# Patient Record
Sex: Male | Born: 1965 | Race: White | Hispanic: No | Marital: Single | Smoking: Current every day smoker
Health system: Southern US, Community
[De-identification: ages and names within clinical notes are randomized; demographics above are authoritative.]

## PROBLEM LIST (undated history)

## (undated) DIAGNOSIS — I10 Essential (primary) hypertension: Secondary | ICD-10-CM

## (undated) DIAGNOSIS — E669 Obesity, unspecified: Secondary | ICD-10-CM

## (undated) DIAGNOSIS — M654 Radial styloid tenosynovitis [de Quervain]: Secondary | ICD-10-CM

## (undated) DIAGNOSIS — F1190 Opioid use, unspecified, uncomplicated: Secondary | ICD-10-CM

## (undated) DIAGNOSIS — K4090 Unilateral inguinal hernia, without obstruction or gangrene, not specified as recurrent: Secondary | ICD-10-CM

## (undated) DIAGNOSIS — M1990 Unspecified osteoarthritis, unspecified site: Secondary | ICD-10-CM

## (undated) DIAGNOSIS — M545 Low back pain, unspecified: Secondary | ICD-10-CM

## (undated) DIAGNOSIS — F1090 Alcohol use, unspecified, uncomplicated: Secondary | ICD-10-CM

## (undated) DIAGNOSIS — J432 Centrilobular emphysema: Secondary | ICD-10-CM

## (undated) DIAGNOSIS — A6000 Herpesviral infection of urogenital system, unspecified: Secondary | ICD-10-CM

## (undated) DIAGNOSIS — I4510 Unspecified right bundle-branch block: Secondary | ICD-10-CM

## (undated) DIAGNOSIS — E785 Hyperlipidemia, unspecified: Secondary | ICD-10-CM

## (undated) DIAGNOSIS — I209 Angina pectoris, unspecified: Secondary | ICD-10-CM

## (undated) DIAGNOSIS — I2510 Atherosclerotic heart disease of native coronary artery without angina pectoris: Principal | ICD-10-CM

## (undated) DIAGNOSIS — M5030 Other cervical disc degeneration, unspecified cervical region: Secondary | ICD-10-CM

## (undated) DIAGNOSIS — M5135 Other intervertebral disc degeneration, thoracolumbar region: Secondary | ICD-10-CM

## (undated) DIAGNOSIS — Z7982 Long term (current) use of aspirin: Secondary | ICD-10-CM

## (undated) DIAGNOSIS — I5189 Other ill-defined heart diseases: Secondary | ICD-10-CM

## (undated) DIAGNOSIS — Z720 Tobacco use: Secondary | ICD-10-CM

## (undated) DIAGNOSIS — N529 Male erectile dysfunction, unspecified: Secondary | ICD-10-CM

## (undated) HISTORY — PX: APPENDECTOMY: SHX54

## (undated) HISTORY — DX: Atherosclerotic heart disease of native coronary artery without angina pectoris: I25.10

## (undated) HISTORY — DX: Hyperlipidemia, unspecified: E78.5

---

## 1995-12-23 ENCOUNTER — Encounter: Payer: Self-pay | Admitting: Family Medicine

## 1995-12-23 LAB — CONVERTED CEMR LAB
RBC count: 5.24 10*6/uL
WBC, blood: 8.5 10*3/uL

## 1996-01-27 ENCOUNTER — Encounter: Payer: Self-pay | Admitting: Family Medicine

## 1996-01-27 LAB — CONVERTED CEMR LAB: PSA: 0.6 ng/mL

## 2006-05-16 ENCOUNTER — Ambulatory Visit: Payer: Self-pay | Admitting: Family Medicine

## 2006-05-16 ENCOUNTER — Inpatient Hospital Stay (HOSPITAL_COMMUNITY): Admission: EM | Admit: 2006-05-16 | Discharge: 2006-05-17 | Payer: Self-pay | Admitting: Emergency Medicine

## 2006-05-16 ENCOUNTER — Encounter (INDEPENDENT_AMBULATORY_CARE_PROVIDER_SITE_OTHER): Payer: Self-pay | Admitting: *Deleted

## 2007-03-03 ENCOUNTER — Telehealth (INDEPENDENT_AMBULATORY_CARE_PROVIDER_SITE_OTHER): Payer: Self-pay | Admitting: *Deleted

## 2008-09-11 ENCOUNTER — Ambulatory Visit: Payer: Self-pay | Admitting: Family Medicine

## 2008-09-16 ENCOUNTER — Encounter: Payer: Self-pay | Admitting: Family Medicine

## 2008-09-16 DIAGNOSIS — Z87898 Personal history of other specified conditions: Secondary | ICD-10-CM

## 2008-11-01 ENCOUNTER — Ambulatory Visit: Payer: Self-pay | Admitting: Family Medicine

## 2008-11-01 DIAGNOSIS — J069 Acute upper respiratory infection, unspecified: Secondary | ICD-10-CM | POA: Insufficient documentation

## 2009-05-29 ENCOUNTER — Telehealth: Payer: Self-pay | Admitting: Family Medicine

## 2009-05-29 DIAGNOSIS — A6000 Herpesviral infection of urogenital system, unspecified: Secondary | ICD-10-CM | POA: Insufficient documentation

## 2009-10-22 ENCOUNTER — Ambulatory Visit: Payer: Self-pay | Admitting: Family Medicine

## 2009-10-22 DIAGNOSIS — F17200 Nicotine dependence, unspecified, uncomplicated: Secondary | ICD-10-CM

## 2010-06-02 ENCOUNTER — Encounter (INDEPENDENT_AMBULATORY_CARE_PROVIDER_SITE_OTHER): Payer: Self-pay | Admitting: *Deleted

## 2010-11-26 NOTE — Letter (Signed)
Summary: Nadara Eaton letter  [facility]  [address] [place], [place] [address]   Phone: [phone]  Fax: [phone]       06/02/2010 MRN: [id number]  GYAN CAMBRE [address] [place], [place]  [address]  Dear Mr. [facility], and  announce the retirement of Arta Silence, M.D., from full-time practice at the [facility] office effective April 23, 2010 and his plans of returning part-time.  It is important to Dr. Hetty Ely and to our practice that you understand that [facility] has seven physicians in our office for your health care needs.  We will continue to offer the same exceptional care that you have today.    Dr. Hetty Ely has spoken to many of you about his plans for retirement and returning part-time in the fall.   We will continue to work with you through the transition to schedule appointments for you in the office and meet the high standards that [facility] is committed to.   Again, it is with great pleasure that we share the news that Dr. Hetty Ely will return to [facility] at [facility] in October of 2011 with a reduced schedule.    If you have any questions, or would like to request an appointment with one of our physicians, please call us at [phone] and press the option for Scheduling an appointment.  We take pleasure in providing you with excellent patient care and look forward to seeing you at your next office visit.  Our [facility] Physicians are:  Tillman Abide, M.D. Laurita Quint, M.D. Roxy Manns, M.D. Kerby Nora, M.D. Hannah Beat, M.D. Ruthe Mannan, M.D. We proudly welcomed Raechel Ache, M.D. and Eustaquio Boyden, M.D. to the practice in July/August 2011.  Sincerely,  [facility]

## 2011-03-12 NOTE — Op Note (Signed)
NAME:  [name], [name]                  ACCOUNT NO.:  [id number]   MEDICAL RECORD NO.:  [id number]          PATIENT TYPE:  INP   LOCATION:  5729                         FACILITY:  [facility]   PHYSICIAN:  Sandria Bales. Ezzard Standing, M.D.  DATE OF BIRTH:  December 21, 1965   DATE OF PROCEDURE:  05/16/2006  DATE OF DISCHARGE:                                 OPERATIVE REPORT   PREOPERATIVE DIAGNOSIS:  Appendicitis.   POSTOPERATIVE DIAGNOSIS:  Ruptured appendicitis with a rupture at the base.   PROCEDURE:  Laparoscopic appendectomy.   SURGEON:  Ovidio Kin, MD   ANESTHESIA:  General endotracheal.   ESTIMATED BLOOD LOSS:  Minimal.   REASON FOR PROCEDURE:  [name] is a 45 year old white male who has  presented with a 24-hour history of increasing abdominal pain, which  localized to his right lower quadrant.  He has a physical exam consistent  with appendicitis and elevated white blood count to about 23,000.  He now  comes for attempted laparoscopic appendectomy.  Other potential diagnoses  were discussed with the patient, including but not limited to  diverticulitis, Crohn disease, and other inflammatory condition.   The indications for the procedure and complications of the procedure were  explained to the patient, potential complications including but not limited  to bleeding, infection, bowel injury, and open surgery.   OPERATIVE NOTE:  The patient was placed in a supine position with his left  arm prepped, right arm out to the side.  Foley catheter in place.  He was  given 2 g of cefoxitin prior to the procedure.   His abdomen was shaved.  An infraumbilical incision was made with sharp  dissection carried down into the abdominal cavity.  A 0-degree, 10-mm  laparoscope was inserted through a 12-mm Hasson trocar and the Hasson trocar  secured with a 0 Vicryl suture.   The abdomen was explored.  Right and left lobes of the liver unremarkable.  There was a fair amount of purulence along his right  colonic gutter.  I did  place 2 additional trocars, a 5-mm trocar in the right subcostal location  and a 10-mm trocar in the left lower quadrant.   The appendix was identified.  It was acutely inflamed and necrotic.  The  Harmonic scalpel was used to dissect it up, take the mesentery down.  At the  base of the appendix, only about maybe 1.5 cm from the verge where it comes  off the cecum, the appendix had ruptured, and I was able to catch this  mesentery and pull the appendix up and get a blue load Endo-GIA 45-mm  stapler across the appendiceal base.   I then, after dividing the appendix, placed the appendix in an EndoCatch bag  and delivered it to the umbilicus.  I then reinspected the abdomen and  irrigated it with about 1800 mL of saline, looking at the staple line, which  looked good, looking at the mesentery, which there was no evidence of any  bleeding, and irrigating any kind of purulent material I could.   The patient tolerated the procedure well.  The trocars were then removed in  turn.  Umbilical port was closed with a 0-Vicryl suture.  The skin edges  were approximated with a 5-0 Vicryl suture, followed by a tincture of  benzoin and Steri-Stripped.   The patient tolerated the procedure well.  Sponge and needle count were  correct.      Sandria Bales. Ezzard Standing, M.D.  Electronically Signed     DHN/MEDQ  D:  05/16/2006  T:  05/17/2006  Job:  [id number]

## 2011-03-12 NOTE — Discharge Summary (Signed)
NAME:  [name], [name]                  ACCOUNT NO.:  [id number]   MEDICAL RECORD NO.:  [id number]          PATIENT TYPE:  INP   LOCATION:  5729                         FACILITY:  [facility]   PHYSICIAN:  Sandria Bales. Ezzard Standing, M.D.  DATE OF BIRTH:  08-May-1966   DATE OF ADMISSION:  05/16/2006  DATE OF DISCHARGE:  05/17/2006                                 DISCHARGE SUMMARY   CHIEF COMPLAINT/REASON FOR ADMISSION:  [name] is a 45 year old white  male normally healthy who developed abdominal pain less than 24 hours  duration.  He had significant nausea, attempted milk of magnesia and Tums  without any improvements in his symptoms.  On exam, he was afebrile, vital  signs were stable.  His abdomen was exquisitely tender in the right lower  quadrant with guarding and rebounding.  His white count ws 22,000.  No other  labs were available at the time of admission.  Based on these finding, Dr.  Ezzard Standing felt that the patient had a diagnosis of acute appendicitis.   ADMISSION DIAGNOSIS:  Probable acute appendicitis.   HOSPITAL COURSE:  The patient was taken directly from the emergency  department to the operating room by Dr. Ezzard Standing where he under went a  laparoscopic appendectomy.  Postoperative diagnosis was ruptured  appendicitis.  The appendix was ruptured at the base.  The patient tolerated  the procedure well and went to the post anesthesia care unit and then to the  floor to recover.   On postoperative day #1, patient was afebrile, vital signs were stable, was  tolerating a regular diet with flatus and requesting to go home.  He was  having incisional pain, but otherwise abdomen was benign, soft, with bowel  sounds present.  Because the patient had significant leukocytosis, white  count 24,400, on May 16, 2006 a repeat white cell count was obtained before  patient was discharged home.  This was down to 17,000.  The patient, later  in the day, was tolerating a diet advance and oral pain medication  with no  increasing abdominal pain and remained afebrile, and was deemed appropriate  for discharge home on oral antibiotics and pain medication.   FINAL DISCHARGE DIAGNOSES:  1. Acute appendicitis, perforated at the base.  2. Status post laparoscopic appendectomy.   DISCHARGE MEDICATIONS:  1. Vicodin 5/500 1 to 2 every 6 hours as needed for pain.  2. Augmentin 875 mg 1 b.i.d. for 10 days.   DIET:  No restrictions.   Return to work after seeing Dr. Ezzard Standing.   ACTIVITY:  No driving for 2 weeks.  Increase activity slowly.  May shower.  No lifting for 3 weeks.   WOUND CARE:  Allow Steri-Strips to fall off.  Remove abdominal dressings in  the morning on May 18, 2006.   ADDITIONAL INSTRUCTIONS:  Call MD if:  A.  Oral temperature more than 100.5 degrees Fahrenheit.  B.  Increase in belly pain.  C.  Cannot eat, nausea or vomiting.  D.  Redness or drainage from surgery wounds.   FOLLOWUP:  Dr. Ezzard Standing [phone].  Have an  appointment schedule for May 25, 2006 at 9:00 a.m.      Allison L. Rennis Harding, N.P.      Sandria Bales. Ezzard Standing, M.D.  Electronically Signed    ALE/MEDQ  D:  06/10/2006  T:  06/11/2006  Job:  [id number]   cc:   Lance A. Milinda Antis, MD

## 2011-03-12 NOTE — H&P (Signed)
NAME:  [name]                  ACCOUNT NO.:  [id number]   MEDICAL RECORD NO.:  [id number]          PATIENT TYPE:  INP   LOCATION:  1829                         FACILITY:  [facility]   PHYSICIAN:  Sandria Bales. Ezzard Standing, M.D.  DATE OF BIRTH:  1966-04-28   DATE OF PROCEDURE:  DATE OF DISCHARGE:                      STAT - MUST CHANGE TO CORRECT WORK TYPE   HISTORY OF PRESENT ILLNESS:  This is a 45 year old white male in otherwise  good health.  He denies any history of peptic ulcer disease, liver disease,  colon disease.  He has had no prior abdominal complaints or surgery.  He has  had increasing abdominal pain since yesterday.  His pain began while he was  in [place], [place]. 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  He had increased nausea last night, and he tried milk of magnesia,  he tried Tums, all of that with no improvement in symptoms.   He went to see Dr. Roxy Manns at [facility] this  morning, and she sent him by ambulance to the emergency room.   He had a lot of pain when his abdomen hit bumps, with tenderness and  guarding of his lower abdomen.   PAST MEDICAL HISTORY:   ALLERGIES:  AMOXICILLIN which made him itch.  This was when he was a  teenager.   MEDICATIONS:  He is on no regular medications.   REVIEW OF SYSTEMS:  NEUROLOGIC: No seizures or loss of consciousness.  PULMONARY:  Does not smoke. no history of pnuemonia  CARDIAC:  He had no chest pain or angina.  GASTROINTESTINAL:  See history of present illness.  UROLOGIC:  No history of stones or kidney infections.   SOCIAL HISTORY:  He is separated from his wife.  They have no children.  He  works Psychologist, clinical.  I spoke to his mother preop by  cell phone.   PHYSICAL EXAMINATION:  VITAL SIGNS:  Temperature is 97.1, pulse 76,  respirations 24, blood pressure 123/66.  GENERAL:  He is somewhat flushed in the face, like he has an inflammatory  response  going on.  NECK:  Supple.  No mass or thyromegaly.  LUNGS:  Clear to auscultation.  HEART:  Regular rate and rhythm without murmur or rub.  ABDOMEN:  He has exquisite tenderness in his right lower quadrant with  guarding and rebound.  No hernia, no mass.  GENITALIA:  Both his testicles are descended.  EXTREMITIES:  He has good strength in all 4 extremities.  NEUROLOGIC:  Grossly intact.   LABORATORY DATA:  His white blood count is approximately 22,000.  I do not  have his other labs back at this time.   IMPRESSION:  He has probable appendicitis.  I think he should go to the  operating room.  I have explained to the patient about appendectomy, that I  can do this laparoscopically, and the potential of other diagnoses,  including Crohn's disease, diverticular disease or some other unknown  developed process.   He would be best served by having  laparoscopic evaluation with an  appendectomy.  I discussed potential complications which include infection,  which I think he has already got going on, bleeding, need for open surgery.      Sandria Bales. Ezzard Standing, M.D.  Electronically Signed     DHN/MEDQ  D:  05/16/2006  T:  05/16/2006  Job:  [id number]   cc:   Marne A. Tower, M.D. [facility]  [address], [place]  [place] [address]

## 2012-02-23 DIAGNOSIS — I214 Non-ST elevation (NSTEMI) myocardial infarction: Secondary | ICD-10-CM

## 2012-02-23 HISTORY — DX: Non-ST elevation (NSTEMI) myocardial infarction: I21.4

## 2012-02-23 HISTORY — PX: CORONARY STENT PLACEMENT: SHX1402

## 2012-03-17 ENCOUNTER — Encounter (HOSPITAL_COMMUNITY): Payer: Self-pay | Admitting: *Deleted

## 2012-03-17 ENCOUNTER — Inpatient Hospital Stay (HOSPITAL_COMMUNITY)
Admission: EM | Admit: 2012-03-17 | Discharge: 2012-03-19 | DRG: 247 | Disposition: A | Discharge status: Home / Self Care | Payer: 59 | Attending: Cardiovascular Disease | Admitting: Cardiovascular Disease

## 2012-03-17 ENCOUNTER — Emergency Department (HOSPITAL_COMMUNITY): Payer: Self-pay

## 2012-03-17 ENCOUNTER — Encounter (HOSPITAL_COMMUNITY)
Admission: EM | Disposition: A | Discharge status: Home / Self Care | Payer: Self-pay | Source: Home / Self Care | Attending: Cardiovascular Disease

## 2012-03-17 DIAGNOSIS — Z955 Presence of coronary angioplasty implant and graft: Secondary | ICD-10-CM

## 2012-03-17 DIAGNOSIS — I2510 Atherosclerotic heart disease of native coronary artery without angina pectoris: Secondary | ICD-10-CM

## 2012-03-17 DIAGNOSIS — E669 Obesity, unspecified: Secondary | ICD-10-CM | POA: Diagnosis present

## 2012-03-17 DIAGNOSIS — I214 Non-ST elevation (NSTEMI) myocardial infarction: Principal | ICD-10-CM

## 2012-03-17 DIAGNOSIS — F17200 Nicotine dependence, unspecified, uncomplicated: Secondary | ICD-10-CM | POA: Diagnosis present

## 2012-03-17 DIAGNOSIS — M545 Low back pain, unspecified: Secondary | ICD-10-CM | POA: Diagnosis present

## 2012-03-17 DIAGNOSIS — Z6833 Body mass index (BMI) 33.0-33.9, adult: Secondary | ICD-10-CM

## 2012-03-17 HISTORY — DX: Essential (primary) hypertension: I10

## 2012-03-17 HISTORY — DX: Atherosclerotic heart disease of native coronary artery without angina pectoris: I25.10

## 2012-03-17 HISTORY — DX: Low back pain: M54.5

## 2012-03-17 HISTORY — DX: Obesity, unspecified: E66.9

## 2012-03-17 HISTORY — PX: INTRAVASCULAR ULTRASOUND: SHX5452

## 2012-03-17 HISTORY — DX: Tobacco use: Z72.0

## 2012-03-17 HISTORY — PX: LEFT HEART CATHETERIZATION WITH CORONARY ANGIOGRAM: SHX5451

## 2012-03-17 HISTORY — DX: Low back pain, unspecified: M54.50

## 2012-03-17 LAB — TSH: TSH: 0.673 u[IU]/mL (ref 0.350–4.500)

## 2012-03-17 LAB — BASIC METABOLIC PANEL
BUN: 18 mg/dL (ref 6–23)
Chloride: 100 mEq/L (ref 96–112)
Glucose, Bld: 114 mg/dL — ABNORMAL HIGH (ref 70–99)
Sodium: 136 mEq/L (ref 135–145)

## 2012-03-17 LAB — CBC
MCHC: 34.9 g/dL (ref 30.0–36.0)
MCV: 86.4 fL (ref 78.0–100.0)
Platelets: 249 10*3/uL (ref 150–400)
RDW: 12.7 % (ref 11.5–15.5)

## 2012-03-17 LAB — POCT I-STAT TROPONIN I: Troponin i, poc: 0.11 ng/mL (ref 0.00–0.08)

## 2012-03-17 LAB — CARDIAC PANEL(CRET KIN+CKTOT+MB+TROPI)
CK, MB: 105.3 ng/mL (ref 0.3–4.0)
Relative Index: 8.2 — ABNORMAL HIGH (ref 0.0–2.5)
Relative Index: 7.8 — ABNORMAL HIGH (ref 0.0–2.5)
Total CK: 1352 U/L — ABNORMAL HIGH (ref 7–232)

## 2012-03-17 LAB — APTT: aPTT: 30 seconds (ref 24–37)

## 2012-03-17 LAB — PROTIME-INR: Prothrombin Time: 12.6 seconds (ref 11.6–15.2)

## 2012-03-17 SURGERY — LEFT HEART CATHETERIZATION WITH CORONARY ANGIOGRAM
Anesthesia: LOCAL

## 2012-03-17 MED ORDER — MORPHINE SULFATE 4 MG/ML IJ SOLN
INTRAMUSCULAR | Status: AC
  Administered 2012-03-17: 4 mg
  Filled 2012-03-17: qty 1

## 2012-03-17 MED ORDER — FENTANYL CITRATE 0.05 MG/ML IJ SOLN
INTRAMUSCULAR | Status: AC
  Filled 2012-03-17: qty 2

## 2012-03-17 MED ORDER — NITROGLYCERIN 0.4 MG SL SUBL: 0.4000 mg | SUBLINGUAL_TABLET | SUBLINGUAL | Status: DC | PRN

## 2012-03-17 MED ORDER — LIDOCAINE HCL (PF) 1 % IJ SOLN
INTRAMUSCULAR | Status: AC
  Filled 2012-03-17: qty 30

## 2012-03-17 MED ORDER — NITROGLYCERIN IN D5W 200-5 MCG/ML-% IV SOLN
2.0000 ug/min | Freq: Once | INTRAVENOUS | Status: AC
  Administered 2012-03-17: 5 ug/min via INTRAVENOUS
  Filled 2012-03-17: qty 250

## 2012-03-17 MED ORDER — SODIUM CHLORIDE 0.9 % IJ SOLN: 3.0000 mL | INTRAMUSCULAR | Status: DC | PRN

## 2012-03-17 MED ORDER — OXYCODONE-ACETAMINOPHEN 5-325 MG PO TABS: 1.0000 | ORAL_TABLET | ORAL | Status: DC | PRN

## 2012-03-17 MED ORDER — ONDANSETRON HCL 4 MG/2ML IJ SOLN: 4.0000 mg | Freq: Four times a day (QID) | INTRAMUSCULAR | Status: DC | PRN

## 2012-03-17 MED ORDER — MORPHINE SULFATE 2 MG/ML IJ SOLN
2.0000 mg | Freq: Once | INTRAMUSCULAR | Status: AC
  Administered 2012-03-17: 2 mg via INTRAVENOUS

## 2012-03-17 MED ORDER — MIDAZOLAM HCL 2 MG/2ML IJ SOLN
INTRAMUSCULAR | Status: AC
  Filled 2012-03-17: qty 2

## 2012-03-17 MED ORDER — ASPIRIN 81 MG PO CHEW
324.0000 mg | CHEWABLE_TABLET | Freq: Once | ORAL | Status: AC
  Administered 2012-03-17: 324 mg via ORAL
  Filled 2012-03-17: qty 4

## 2012-03-17 MED ORDER — ACETAMINOPHEN 325 MG PO TABS: 650.0000 mg | ORAL_TABLET | ORAL | Status: DC | PRN

## 2012-03-17 MED ORDER — SODIUM CHLORIDE 0.9 % IJ SOLN
3.0000 mL | Freq: Two times a day (BID) | INTRAMUSCULAR | Status: DC
  Administered 2012-03-17 – 2012-03-19 (×4): 3 mL via INTRAVENOUS

## 2012-03-17 MED ORDER — ASPIRIN EC 81 MG PO TBEC
81.0000 mg | DELAYED_RELEASE_TABLET | Freq: Every day | ORAL | Status: DC
  Administered 2012-03-18 – 2012-03-19 (×2): 81 mg via ORAL
  Filled 2012-03-17 (×2): qty 1

## 2012-03-17 MED ORDER — DIAZEPAM 5 MG PO TABS
5.0000 mg | ORAL_TABLET | ORAL | Status: DC | PRN
  Administered 2012-03-17 – 2012-03-18 (×2): 5 mg via ORAL
  Filled 2012-03-17 (×2): qty 1

## 2012-03-17 MED ORDER — HEPARIN BOLUS VIA INFUSION
4000.0000 [IU] | Freq: Once | INTRAVENOUS | Status: AC
  Administered 2012-03-17: 4000 [IU] via INTRAVENOUS

## 2012-03-17 MED ORDER — SODIUM CHLORIDE 0.9 % IV SOLN
0.2500 mg/kg/h | INTRAVENOUS | Status: AC
  Administered 2012-03-17: 0.25 mg/kg/h via INTRAVENOUS
  Filled 2012-03-17: qty 250

## 2012-03-17 MED ORDER — SODIUM CHLORIDE 0.9 % IV SOLN: 250.0000 mL | INTRAVENOUS | Status: DC | PRN

## 2012-03-17 MED ORDER — ATORVASTATIN CALCIUM 80 MG PO TABS
80.0000 mg | ORAL_TABLET | Freq: Every day | ORAL | Status: DC
  Administered 2012-03-17 – 2012-03-18 (×2): 80 mg via ORAL
  Filled 2012-03-17 (×3): qty 1

## 2012-03-17 MED ORDER — HEPARIN SODIUM (PORCINE) 5000 UNIT/ML IJ SOLN
INTRAMUSCULAR | Status: AC
  Filled 2012-03-17: qty 1

## 2012-03-17 MED ORDER — METOPROLOL TARTRATE 12.5 MG HALF TABLET
12.5000 mg | ORAL_TABLET | Freq: Two times a day (BID) | ORAL | Status: DC
  Administered 2012-03-17 – 2012-03-19 (×4): 12.5 mg via ORAL
  Filled 2012-03-17 (×5): qty 1

## 2012-03-17 MED ORDER — NITROGLYCERIN 0.2 MG/ML ON CALL CATH LAB
INTRAVENOUS | Status: AC
  Filled 2012-03-17: qty 1

## 2012-03-17 MED ORDER — HYDROCODONE-ACETAMINOPHEN 5-325 MG PO TABS: 1.0000 | ORAL_TABLET | Freq: Four times a day (QID) | ORAL | Status: DC | PRN

## 2012-03-17 MED ORDER — BIVALIRUDIN 250 MG IV SOLR
INTRAVENOUS | Status: AC
  Filled 2012-03-17: qty 250

## 2012-03-17 MED ORDER — HEPARIN (PORCINE) IN NACL 100-0.45 UNIT/ML-% IJ SOLN
1000.0000 [IU]/h | INTRAMUSCULAR | Status: DC
  Administered 2012-03-17: 1000 [IU]/h via INTRAVENOUS
  Filled 2012-03-17: qty 250

## 2012-03-17 MED ORDER — MORPHINE SULFATE 4 MG/ML IJ SOLN
4.0000 mg | Freq: Once | INTRAMUSCULAR | Status: AC
  Administered 2012-03-17: 4 mg via INTRAVENOUS
  Filled 2012-03-17: qty 1

## 2012-03-17 MED ORDER — MORPHINE SULFATE 2 MG/ML IJ SOLN
INTRAMUSCULAR | Status: AC
  Administered 2012-03-17: 2 mg via INTRAVENOUS
  Filled 2012-03-17: qty 1

## 2012-03-17 MED ORDER — SODIUM CHLORIDE 0.9 % IV SOLN: INTRAVENOUS | Status: AC

## 2012-03-17 MED ORDER — ONDANSETRON HCL 4 MG/2ML IJ SOLN
INTRAMUSCULAR | Status: AC
  Administered 2012-03-17: 4 mg
  Filled 2012-03-17: qty 2

## 2012-03-17 MED ORDER — PRASUGREL HCL 10 MG PO TABS
ORAL_TABLET | ORAL | Status: AC
  Filled 2012-03-17: qty 6

## 2012-03-17 MED ORDER — PRASUGREL HCL 10 MG PO TABS
10.0000 mg | ORAL_TABLET | Freq: Every day | ORAL | Status: DC
  Administered 2012-03-18 – 2012-03-19 (×2): 10 mg via ORAL
  Filled 2012-03-17 (×2): qty 1

## 2012-03-17 MED ORDER — HEPARIN (PORCINE) IN NACL 2-0.9 UNIT/ML-% IJ SOLN
INTRAMUSCULAR | Status: AC
  Filled 2012-03-17: qty 2000

## 2012-03-17 MED ORDER — MORPHINE SULFATE 2 MG/ML IJ SOLN: 2.0000 mg | INTRAMUSCULAR | Status: DC | PRN

## 2012-03-17 NOTE — Progress Notes (Signed)
CRITICAL VALUE ALERT  Critical value received:  TROPO>25 Date of notification:  03/17/12  Time of notification:  1808  Critical value read back:yes  Nurse who received alert: Arvella Merles I   MD notified  expected result    Time of first page:    MD notified (2nd page):  Time of second page:  Responding MD:    Time MD responded:

## 2012-03-17 NOTE — Progress Notes (Signed)
ADMITTED FROM CATH LAB. BY BED AWAKE AND ALERT. TR BAND TO LEFT WRIST INTACT DENIED ANY DISCOMFORT.

## 2012-03-17 NOTE — ED Notes (Signed)
MD at bedside. 

## 2012-03-17 NOTE — CV Procedure (Signed)
Cardiac Catheterization Operative Report  [name] [id number] 5/24/201312:29 PM Crawford Givens, MD, MD  Procedure Performed:  1. Left Heart Catheterization 2. Selective Coronary Angiography 3. Left ventricular angiogram 4. PTCA/balloon angioplasty Diagonal branch 5. PTCA/DES x 1 mid LAD 6. IVUS mid LAD  Operator: Verne Carrow, MD  Arterial access site:  Right radial artery.   Indication:  NSTEMI, ongoing chest pain.                                    Procedure Details: The risks, benefits, complications, treatment options, and expected outcomes were discussed with the patient. The patient and/or family concurred with the proposed plan, giving informed consent. The patient was brought to the cath lab after IV hydration was begun and oral premedication was given. The patient was further sedated with Versed and Fentanyl. The leftt wrist was assessed with an Allens test which was positive. The left wrist was prepped and draped in a sterile fashion. 1% lidocaine was used for local anesthesia. Using the modified Seldinger access technique, a 6 French sheath was placed in the left radial artery. 1.25 mg Nicardipine was given through the sheath. 3500 units IV heparin was given. Standard diagnostic catheters were used to perform selective coronary angiography. A pigtail catheter was used to perform a left ventricular angiogram. The patient was found to have a totally occluded small diagonal branch. This vessel was 1.5 mm in diameter and felt to be too small for PCI. He also had a hazy mid LAD lesion involving a moderate sized diagonal branch. He was given a bolus of Angiomax and a drip was started. When the ACT was greater than 200, I engaged the left main with a XB LAD 3.5 guiding catheter. A Cougar IC wire was advanced down the LAD.  I then advanced an IVUS catheter down into the mid LAD. IVUS images were obtained from the mid LAD back into the proximal vessel via mechical  pullback. The lesion in the mid LAD was severe and involved the diagonal. The IVUS catheter was removed. I then passed a second Cougar IC wire down the Diagonal branch. A 2.0 x 8 mm balloon was used to dilate the proximal segment of the diagonal branch. This balloon was inflated 3 times in this area. I then advanced a 2.5 x 15 mm balloon into the mid LAD and inflated x 2. A 3.0 x 28 mm Promus Element DES was deployed in the mid LAD. A 3.25 x 20 mm St. Donatus balloon was used to post-dilate the stent. I then used a 3.5 x 8 mm [place] balloon to post-dilate the proximal segment of the stent x 1. There was an excellent result with good flow down the diagonal branch and the LAD. I elected not to intervene on the very small early Diagonal branch which is totally occluded. All wires and the guide were removed.   The sheath was removed from the right radial artery and a Terumo hemostasis band was applied at the arteriotomy site on the right wrist.  There were no immediate complications. The patient was taken to the recovery area in stable condition.    Hemodynamic Findings: Central aortic pressure: 113/73 Left ventricular pressure: 106/16/29  Angiographic Findings:  Left main: No obstructive disease noted.   Left Anterior Descending Artery: Large caliber vessel that courses to the apex. 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  The mid vessel has hazy 70-80% stenosis. The distal vessel has no disease. The first early diagonal branch is very small (1.5 mm) and fills slowly, sub-total occlusion. The second diagonal branch is moderate sized, bifurcating vessel with proximal 80% stenosis.   Circumflex Artery: Large caliber vessel with no obstructive disease. The obtuse marginal branch is large and has no disease.   Right Coronary Artery: Large, dominant vessel with diffuse 30% plaque throughout the mid vessel.   Left Ventricular Angiogram: LVEF 55%.   Impression: 1.  NSTEMI 2.   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  3.  Subtotally occluded very small early diagonal branch which may be culprit for his ongoing pain. Too small for PCI 4.  Preserved LV systolic function  Recommendations: He will be continued on ASA and Effient for one year. Will start beta blocker and statin as pt tolerates. To TCU. Out to floor in am or d/c home in am if doing well.        Complications:  None. The patient tolerated the procedure well.

## 2012-03-17 NOTE — ED Notes (Signed)
MD made aware of patients increase in CP.  Orders received.

## 2012-03-17 NOTE — ED Notes (Signed)
Patient states he had onset of chest pain last night that he thought was indigestion.  Pt was restless over night. Pt was getting ready for work today and had sudden onset of severe chest pain.  Pt had diaphoresis with same. Pt denies any SOB but states that he feels like he is holding his breat because of pain.  Pt states that he has nausea with same, no vomiting. Pt is a smoker and has family hx of cardiac disease.  Patient has no noted edema.  Pt is over weight.

## 2012-03-17 NOTE — ED Notes (Signed)
LeBaurer NP at bedside.

## 2012-03-17 NOTE — ED Provider Notes (Signed)
Medical screening examination/treatment/procedure(s) were conducted as a shared visit with non-physician practitioner(s) and myself.  I personally evaluated the patient during the encounter 46 yo man had mild chest pain last night, much worse chest pain this morning. He took no medicine for this symptom. His father had a heart attack in his mid forties. Exam shows him to be in moderate distress with chest pain. Lungs clear, heart sounds normal, abdomen non-tender. EKG shows no acute change but TNI is elevated at 0.11. Dx NSTEMI. Rx with aspirin, SL NTG --> NTG infusion, IV heparin. Call to [facility], 9:25 AM  who will see pt.        Carleene Cooper III, MD 03/17/12 2043

## 2012-03-17 NOTE — Interval H&P Note (Signed)
History and Physical Interval Note:  03/17/2012 10:48 AM  [name]  has presented today for surgery, with the diagnosis of chest pain  The various methods of treatment have been discussed with the patient and family. After consideration of risks, benefits and other options for treatment, the patient has consented to  Procedure(s) (LRB): LEFT HEART CATHETERIZATION WITH CORONARY ANGIOGRAM (N/A) as a surgical intervention .  The patients' history has been reviewed, patient examined, no change in status, stable for surgery.  I have reviewed the patients' chart and labs.  Questions were answered to the patient's satisfaction.     [name]

## 2012-03-17 NOTE — ED Notes (Signed)
Patient states he woke due to chest pain and bil arm pain.  Patient complains of sob and dizziness. Patient states his father has hx of cardiac disease

## 2012-03-17 NOTE — Progress Notes (Signed)
46 yo man had mild chest pain last night, much worse chest pain this morning.  He took no medicine for this symptom.  His father had a heart attack in his mid forties.  Exam shows him to be in moderate distress with chest pain.  Lungs clear, heart sounds normal, abdomen non-tender.  EKG shows no acute change but TNI is elevated at 0.11.  Dx NSTEMI.  Rx with aspirin, SL NTG --> NTG infusion, IV heparin.  Call to [facility], 9:25 AM who will see pt.

## 2012-03-17 NOTE — H&P (Signed)
Patient ID: [name] MRN: [id number], DOB/AGE: 01-31-1966   Admit date: 03/17/2012   Primary Physician: Crawford Givens, MD, MD Primary Cardiologist: new to [place] - being seen by M. Excell Seltzer, MD  Pt. Profile:  46 y/o male w/o prior cardiac history who presents with ongoing c/p and elevated troponin.  Problem List  Past Medical History  Diagnosis Date  . Low back pain   . Tobacco abuse     a. 1 1/2 ppd x 20 yrs (02/2012)  . Obesity     Past Surgical History  Procedure Date  . Appendectomy      Allergies  No Known Allergies  HPI  46 y/o male with the above problem list.  He has no personal h/o CAD but his father had multiple MI's - his first in his 74's.  Pt was in his USOH until last PM @ 10PM, when he developed 6/10 sscp/heaviness associated with mild dyspnea and diaphoresis.  He had a restless night, sleeping on and off.  When he got up this AM, pain intensified and he presented to the [facility] ED.  Here, ECG shows inf st depression and trop (POC) is elevated @ 0.11.  He has been treated with asa, mso4, ntg, and IV ntg, and continues to have 6/10 chest pain.  Home Medications  Prior to Admission medications   Medication Sig Start Date End Date Taking? Authorizing Provider  HYDROCODONE-ACETAMINOPHEN PO Take 1 tablet by mouth at bedtime as needed. For back pain   Yes Historical Provider, MD  terbinafine (LAMISIL) 250 MG tablet Take 250 mg by mouth every evening.   Yes Historical Provider, MD   Family History  Family History  Problem Relation Age of Onset  . Heart attack Father     3 mi's - first in 72's.  died @ 105.  Marland Kitchen Hypertension Mother     alive  . Other      2 brothers alive & well   Social History  History   Social History  . Marital Status: Legally Separated    Spouse Name: N/A    Number of Children: N/A  . Years of Education: N/A   Occupational History  . Not on file.   Social History Main Topics  . Smoking status: Current Everyday Smoker  -- 1.5 packs/day for 20 years  . Smokeless tobacco: Not on file  . Alcohol Use: Yes     a few drinks/week.  . Drug Use: No  . Sexually Active: Yes   Other Topics Concern  . Not on file   Social History Narrative   Lives in [place] with girlfriend.  Owns a IT consultant.  Does not routinely exercise or adhere to any particular diet.    Review of Systems General:  No chills, fever, night sweats or weight changes.  Cardiovascular:  ++c/p, sob, l arm pain/numbness, diaphoresis as outlined above.  No edema, orthopnea, palpitations, paroxysmal nocturnal dyspnea. Dermatological: No rash, lesions/masses Respiratory: No cough, dyspnea Urologic: No hematuria, dysuria Abdominal:   No nausea, vomiting, diarrhea, bright red blood per rectum, melena, or hematemesis Neurologic:  No visual changes, wkns, changes in mental status. All other systems reviewed and are otherwise negative except as noted above.  Physical Exam  Blood pressure 100/66, pulse 66, temperature 98.2 F (36.8 C), temperature source Oral, resp. rate 18, height 5\' 11"  (1.803 m), weight 225 lb (102.059 kg), SpO2 100.00%.  General: Pleasant, NAD Psych: Normal affect. Neuro: Alert and oriented X 3. Moves all extremities  spontaneously. HEENT: Normal  Neck: Supple without bruits or JVD. Lungs:  Resp regular and unlabored, CTA. Heart: RRR no s3, s4, or murmurs. Abdomen: Soft, non-tender, non-distended, BS + x 4.  Extremities: No clubbing, cyanosis or edema. DP/PT/Radials 2+ and equal bilaterally.  Labs  No results found for this basename: CKTOTAL:4,CKMB:4,TROPONINI:4 in the last 72 hours Lab Results  Component Value Date   WBC 8.9 03/17/2012   HGB 16.0 03/17/2012   HCT 45.9 03/17/2012   MCV 86.4 03/17/2012   PLT 249 03/17/2012     Lab 03/17/12 0837  NA 136  K 3.7  CL 100  CO2 22  BUN 18  CREATININE 0.88  CALCIUM 9.8  PROT --  BILITOT --  ALKPHOS --  ALT --  AST --  GLUCOSE 114*   Trop - POC  0.11  Radiology/Studies  Chest Portable 1 View  03/17/2012  *RADIOLOGY REPORT*  Clinical Data: Chest pain  PORTABLE CHEST - 1 VIEW    IMPRESSION: No active disease  Original Report Authenticated By: Thomasenia Sales, M.D.   ECG  Rsr, 68, inf st depression.  < 1mm st elevation aVL.  ASSESSMENT AND PLAN  1.  NSTEMI:  Pt with RF including FH, Tob, obesity, presents with a 12 hr history of c/p associated with dyspnea, left arm pain and numbness, and diaphoresis, who has an abnl ECG and elevated troponin - 0.11 (POC).  He continues to have chest pain despite ntg, asa, mso4, and heparin.  Will proceed with urgent cath this AM.  Admit, cycle ce, cont asa, add bb, high dose statin.  2.  Tob Abuse:  Cessation counseling.  3.  Obesity:  Eventual cardiac rehab.   Signed, Nicolasa Ducking, NP 03/17/2012, 10:14 AM  Patient seen, examined. Available data reviewed. Agree with findings, assessment, and plan as outlined by Ward Givens, NP. Pt with classic ACS, positive enzymes, ongoing pain with ST changes on EKG. EKG is NOT diagnostic for STEMI. Plan urgent cath. I discussed risks, indication, alternatives to cath plus/minus PCI with the patient and wife including risk of stroke, bleeding, MI, and death. They understand and agree to proceed. Pt on IV heparin and NTG and he has received ASA.  Tonny Bollman, M.D. 03/17/2012 10:16 AM

## 2012-03-17 NOTE — ED Provider Notes (Signed)
History     CSN: [id number]  Arrival date & time 03/17/12  4098   First MD Initiated Contact with Patient 03/17/12 [phone]      Chief Complaint  Patient presents with  . Chest Pain    (Consider location/radiation/quality/duration/timing/severity/associated sxs/prior treatment) HPI  Patient presents to ER complaining of acute onset of CP that woke him from his sleep a couple of times throughout the night that he states he ignored and went back to sleep but woke him from sleep at 7am with severe increase in pain that has been constant since onset and associated with SOB, nausea and diaphoresis. Patient states pain is in middle of chest and radiated down both arms. He denies fevers, chills, cough, hemoptysis, abdominal pain, vomiting, diarrhea, extremity numbness/tingling/weakness. Denies aggravating or alleviating factors. Patient states he has hx of chronic back pain for which he takes PRN norco but has no other known medical problems and takes no other meds on regular basis. His father had hx of early CAD with MI in his forties and died in 50s for complications with diabetes and his heart per patient. Patient took a norco at 7am without relief of pain but no other meds PTA. He denies illicit drug use but smoke tobacco and uses occasional alcohol.   History reviewed. No pertinent past medical history.  Past Surgical History  Procedure Date  . Appendectomy     No family history on file.  History  Substance Use Topics  . Smoking status: Current Everyday Smoker  . Smokeless tobacco: Not on file  . Alcohol Use: Yes      Review of Systems  All other systems reviewed and are negative.    Allergies  Review of patient's allergies indicates not on file.  Home Medications  No current outpatient prescriptions on file.  BP 137/76  Pulse 66  Temp(Src) 98.2 F (36.8 C) (Oral)  Resp 24  Ht 5\' 11"  (1.803 m)  Wt 225 lb (102.059 kg)  BMI 31.38 kg/m2  SpO2 98%  Physical Exam    Nursing note and vitals reviewed. Constitutional: He is oriented to person, place, and time. He appears well-developed and well-nourished. No distress.  HENT:  Head: Normocephalic and atraumatic.  Eyes: Conjunctivae are normal.  Neck: Normal range of motion. Neck supple.  Cardiovascular: Normal rate, regular rhythm, normal heart sounds and intact distal pulses.  Exam reveals no gallop and no friction rub.   No murmur heard. Pulmonary/Chest: Effort normal and breath sounds normal. No respiratory distress. He has no wheezes. He has no rales. He exhibits no tenderness.  Abdominal: Soft. Bowel sounds are normal. He exhibits no distension and no mass. There is no tenderness. There is no rebound and no guarding.  Musculoskeletal: Normal range of motion. He exhibits no edema and no tenderness.  Neurological: He is alert and oriented to person, place, and time.  Skin: Skin is warm and dry. No rash noted. He is not diaphoretic. No erythema.  Psychiatric: He has a normal mood and affect.    ED Course  Procedures (including critical care time)  PO ASA and SL nitro  IV morphine and fluids.   Date: 03/17/2012  Rate: 68  Rhythm: normal sinus rhythm  QRS Axis: normal  Intervals: normal  ST/T Wave abnormalities: normal  Conduction Disutrbances:incomplete RBBB  Narrative Interpretation:   Old EKG Reviewed: none available  CRITICAL CARE Performed by: Drucie Opitz   Total critical care time: 30  Critical care time was exclusive of separately billable  procedures and treating other patients.  Critical care was necessary to treat or prevent imminent or life-threatening deterioration.  Critical care was time spent personally by me on the following activities: development of treatment plan with patient and/or surrogate as well as nursing, discussions with consultants, evaluation of patient's response to treatment, examination of patient, obtaining history from patient or surrogate, ordering and  performing treatments and interventions, ordering and review of laboratory studies, ordering and review of radiographic studies, pulse oximetry and re-evaluation of patient's condition.  Labs Reviewed  BASIC METABOLIC PANEL - Abnormal; Notable for the following:    Glucose, Bld 114 (*)    All other components within normal limits  POCT I-STAT TROPONIN I - Abnormal; Notable for the following:    Troponin i, poc 0.11 (*)    All other components within normal limits  CBC   Chest Portable 1 View  03/17/2012  *RADIOLOGY REPORT*  Clinical Data: Chest pain  PORTABLE CHEST - 1 VIEW  Comparison: None.  Findings: Artifact overlies chest.  Heart size is normal.  The vascularity is normal.  Lungs are clear.  No effusions.  No bony abnormalities.  IMPRESSION: No active disease  Original Report Authenticated By: Thomasenia Sales, M.D.     1. NSTEMI (non-ST elevated myocardial infarction)    9:44 AM NSTEMI- Dr Ignacia Palma to bedside to see and consulted cardiology. Christain Sacramento PA-C to evaluaute in ER who is here currently.    MDM  VSS. Cardiology at bedside for admission for NSTEMI. Will continue to monitor in ER.         Drucie Opitz, [place] 03/17/12 0945  Patient seen by Dr. Excell Seltzer and will be going directed to cath lab.   [place], [place] 03/17/12 1011

## 2012-03-17 NOTE — Care Management Note (Signed)
    Page 1 of 1   03/17/2012     3:54:47 PM   CARE MANAGEMENT NOTE 03/17/2012  Patient:  ELIUS, ETHEREDGE   Account Number:  [id number]  Date Initiated:  03/17/2012  Documentation initiated by:  Junius Creamer  Subjective/Objective Assessment:   adm post cath     Action/Plan:   lives w girlfirend, no ins   Anticipated DC Date:  03/19/2012   Anticipated DC Plan:  HOME/SELF CARE      DC Planning Services  CM consult  Medication Assistance      Choice offered to / List presented to:             Status of service:   Medicare Important Message given?   (If response is "NO", the following Medicare IM given date fields will be blank) Date Medicare IM given:   Date Additional Medicare IM given:    Discharge Disposition:    Per UR Regulation:  Reviewed for med. necessity/level of care/duration of stay  If discussed at Long Length of Stay Meetings, dates discussed:    Comments:  03/17/12 15:32p debbie Karion Cudd rn,bsn [phone] pt just adm. lives w friend. no ins. pt to be on effient. pt assist form signed by pt, left form in shadow chart for md to sign. effient card so he can get 30day free left w form also. pt will need to send in form w proof of income to be elidg for program.

## 2012-03-18 DIAGNOSIS — I214 Non-ST elevation (NSTEMI) myocardial infarction: Secondary | ICD-10-CM

## 2012-03-18 LAB — CARDIAC PANEL(CRET KIN+CKTOT+MB+TROPI): Total CK: 948 U/L — ABNORMAL HIGH (ref 7–232)

## 2012-03-18 LAB — CBC
HCT: 42.6 % (ref 39.0–52.0)
MCV: 88.4 fL (ref 78.0–100.0)
RBC: 4.82 MIL/uL (ref 4.22–5.81)
RDW: 12.8 % (ref 11.5–15.5)
WBC: 15.2 10*3/uL — ABNORMAL HIGH (ref 4.0–10.5)

## 2012-03-18 LAB — LIPID PANEL
Total CHOL/HDL Ratio: 6.1 RATIO
VLDL: 69 mg/dL — ABNORMAL HIGH (ref 0–40)

## 2012-03-18 LAB — BASIC METABOLIC PANEL
BUN: 11 mg/dL (ref 6–23)
CO2: 26 mEq/L (ref 19–32)
Chloride: 102 mEq/L (ref 96–112)
Creatinine, Ser: 0.91 mg/dL (ref 0.50–1.35)

## 2012-03-18 NOTE — Progress Notes (Signed)
Lance Bottoms, MD, [facility] ABIM Board Certified in Adult Cardiovascular Medicine,Internal Medicine and Critical Care Medicine      Subjective:    Patient is doing well post non-ST elevation myocardial infarction of the LAD distribution.  The patient had balloon angioplasty and drug-eluting stent to the mid LAD.  He's been up and about today.  He appears to have no exercise limitations presently.  He reports no recurrent chest pain or shortness of breath.  He has no orthopnea or PND he has no palpitations.The patient was anticipating going home today but in review of his chart his troponins are markedly elevated out of proportion to the reported ejection fraction by catheterization which was 55%.    Objective:   Weight Range:  Vital Signs:   Temp:  [97.7 F (36.5 C)-98.7 F (37.1 C)] 97.9 F (36.6 C) (05/25 0752) Pulse Rate:  [58-82] 59  (05/25 0752) Resp:  [14-18] 18  (05/25 0440) BP: (100-129)/(51-87) 103/61 mmHg (05/25 0752) SpO2:  [96 %-100 %] 98 % (05/25 0752) Weight:  [245 lb 2.4 oz (111.2 kg)-250 lb 14.1 oz (113.8 kg)] 245 lb 2.4 oz (111.2 kg) (05/25 0752) Last BM Date: 03/17/12  Weight change: Filed Weights   03/17/12 0834 03/17/12 1540 03/18/12 0752  Weight: 225 lb (102.059 kg) 250 lb 14.1 oz (113.8 kg) 245 lb 2.4 oz (111.2 kg)    Intake/Output:   Intake/Output Summary (Last 24 hours) at 03/18/12 0908 Last data filed at 03/18/12 0800  Gross per 24 hour  Intake 820.71 ml  Output   1050 ml  Net -229.29 ml     Physical Exam: General: Pleasant male in no distress overweight Neck normal carotid upstroke and no carotid bruits.  JVP appears to be around 6-7 cm Lungs clear breath sounds bilaterally without any wheezing Heart: Regular rate and rhythm with normal S1 and S2 no pathological murmurs post microinfarction. Extremity exam: No edema. Neuro: alert & orientedx3, cranial nerves grossly intact. moves all 4 extremities w/o difficulty. Affect pleasant  Telemetry:  Telemetry normal sinus rhythm heart rate 62 bpm  Labs: Basic Metabolic Panel:  Lab 03/18/12 1610 03/17/12 0837  NA 138 136  K 4.1 3.7  CL 102 100  CO2 26 22  GLUCOSE 105* 114*  BUN 11 18  CREATININE 0.91 0.88  CALCIUM 8.9 9.8  MG -- --  PHOS -- --     CBC:  Lab 03/18/12 0352 03/17/12 0837  WBC 15.2* 8.9  NEUTROABS -- --  HGB 14.6 16.0  HCT 42.6 45.9  MCV 88.4 86.4  PLT 235 249    Cardiac Enzymes:  Lab 03/18/12 0352 03/17/12 2121 03/17/12 1717  CKTOTAL 948* 1352* 1518*  CKMB 69.9* 105.3* 123.8*  CKMBINDEX -- -- --  TROPONINI >25.00* >25.00* >25.00*     BNP: BNP (last 3 results) No results found for this basename: PROBNP:3 in the last 8760 hours   Other results: EKG: Review today.  Normal sinus rhythm with no acute changes are noted infarct pattern Imaging: Chest Portable 1 View  03/17/2012  *RADIOLOGY REPORT*  Clinical Data: Chest pain  PORTABLE CHEST - 1 VIEW  Comparison: None.  Findings: Artifact overlies chest.  Heart size is normal.  The vascularity is normal.  Lungs are clear.  No effusions.  No bony abnormalities.  IMPRESSION: No active disease  Original Report Authenticated By: Thomasenia Sales, M.D.      Medications:     Scheduled Medications:    . aspirin  324 mg  Oral Once  . aspirin EC  81 mg Oral Daily  . atorvastatin  80 mg Oral q1800  . bivalirudin      . bivalirudin (ANGIOMAX) infusion 5 mg/mL (Cath Lab,ACS,PCI indication)  0.25 mg/kg/hr Intravenous To Cath  . fentaNYL      . fentaNYL      . heparin      . heparin      . heparin  4,000 Units Intravenous Once  . lidocaine      . metoprolol tartrate  12.5 mg Oral BID  . midazolam      .  morphine injection  2 mg Intravenous Once  .  morphine injection  4 mg Intravenous Once  . morphine      . nitroGLYCERIN      . nitroGLYCERIN  2-200 mcg/min Intravenous Once  . ondansetron      . prasugrel      . prasugrel  10 mg Oral Daily  . sodium chloride  3 mL Intravenous Q12H      Infusions:    . sodium chloride 75 mL/hr at 03/17/12 1700  . DISCONTD: heparin 1,000 Units/hr (03/17/12 1003)     PRN Medications:  sodium chloride, acetaminophen, acetaminophen, diazepam, HYDROcodone-acetaminophen, morphine injection, nitroGLYCERIN, ondansetron (ZOFRAN) IV, ondansetron (ZOFRAN) IV, oxyCODONE-acetaminophen, sodium chloride   Assessment:   1. NSTEMI (non-ST elevated myocardial infarction)    Severe stenosis mid LAD and Diagonal branch, now s/p balloon angioplasty of diagonal branch and DES x1 mid LAD.  Subtotally occluded very small early diagonal branch which may be culprit for his ongoing pain. Too small for PCI  Preserved LV systolic function  Elevated triglycerides Normal hemoglobin A1c Normal EKG 03/18/2012    Plan/Discussion:    The patient is doing remarkably well after his non-ST elevation myocardial infarction.  His prognosis is excellent. We will confirm that his ejection fraction is 55% because of the significant discrepancy between his reported ejection fraction in the level of elevated troponins. Patient is currently in the presence of Belenda Cruise and exercise physiologist will have a discussion with the patient about his risk factors in general and also his triglycerides are need to be addressed both to a diet program and exercise. We anticipate that the patient can be discharged early tomorrow. He is currently under good medical regimen and I will make no changes Is also been instructed by Baxter Hire that the medication Effient is extremely important to maintain stent patency tickling the first year after implantation.  The patient should also be aware that no elective surgery should be done certainly not within the first 6 months.  If he does need elective surgery he should discuss this with his cardiologist first.  Dental work including one or 2 to the extractions can be done without stopping his DAPT.   Length of Stay: 1   Alvin Critchley  [facility] 03/18/2012, 9:08 AM

## 2012-03-18 NOTE — Progress Notes (Signed)
CARDIAC REHAB PHASE I   PRE:  Rate/Rhythm: 65 SR    BP: sitting 103/53    SaO2:   MODE:  Ambulation: 350 ft   POST:  Rate/Rhythm: 89 SR    BP: sitting 114/59     SaO2:   Tolerated well. Feels great, no sx. Ed completed and pt requests his name be sent to [facility]. Gave financial aid app (no ins). Pt motivated to make change and quit smoking.  [phone]  Harriet Masson CES, ACSM

## 2012-03-19 DIAGNOSIS — I517 Cardiomegaly: Secondary | ICD-10-CM

## 2012-03-19 MED ORDER — METOPROLOL SUCCINATE ER 25 MG PO TB24: 25.0000 mg | ORAL_TABLET | Freq: Every day | ORAL | Status: DC

## 2012-03-19 MED ORDER — PRASUGREL HCL 10 MG PO TABS: 10.0000 mg | ORAL_TABLET | Freq: Every day | ORAL | Status: DC

## 2012-03-19 MED ORDER — ASPIRIN 81 MG PO TBEC: 81.0000 mg | DELAYED_RELEASE_TABLET | Freq: Every day | ORAL | Status: AC

## 2012-03-19 MED ORDER — ATORVASTATIN CALCIUM 80 MG PO TABS: 80.0000 mg | ORAL_TABLET | Freq: Every day | ORAL | Status: DC

## 2012-03-19 MED ORDER — NITROGLYCERIN 0.4 MG SL SUBL: 0.4000 mg | SUBLINGUAL_TABLET | SUBLINGUAL | Status: DC | PRN

## 2012-03-19 NOTE — Progress Notes (Signed)
Lance Bottoms, MD, [facility] ABIM Board Certified in Adult Cardiovascular Medicine,Internal Medicine and Critical Care Medicine      Subjective:    Patient is feeling well this morning.  Has been walking around the unit with no discomfort or chest pain or shortness of breath.  He feels is ready to go home.    Objective:   Weight Range:  Vital Signs:   Temp:  [97.7 F (36.5 C)-98.4 F (36.9 C)] 98.1 F (36.7 C) (05/26 0803) Pulse Rate:  [63] 63  (05/25 2222) Resp:  [18] 18  (05/26 0803) BP: (91-115)/(48-65) 91/56 mmHg (05/26 0800) SpO2:  [94 %-100 %] 100 % (05/26 0803) Weight:  [244 lb 14.9 oz (111.1 kg)] 244 lb 14.9 oz (111.1 kg) (05/26 0439) Last BM Date: 03/17/12  Weight change: Filed Weights   03/17/12 1540 03/18/12 0752 03/19/12 0439  Weight: 250 lb 14.1 oz (113.8 kg) 245 lb 2.4 oz (111.2 kg) 244 lb 14.9 oz (111.1 kg)    Intake/Output:   Intake/Output Summary (Last 24 hours) at 03/19/12 1308 Last data filed at 03/19/12 0800  Gross per 24 hour  Intake   1130 ml  Output    802 ml  Net    328 ml     Physical Exam: General: Overweight white male but in no distress Lungs: Clear breath sounds bilaterally with no wheezing. Heart: Normal S1 and S2 no S3.  No pathological murmurs Lower extremities: No edema  Telemetry: Sinus bradycardia with incomplete right bundle branch block Labs: Basic Metabolic Panel:  Lab 03/18/12 6578 03/17/12 0837  NA 138 136  K 4.1 3.7  CL 102 100  CO2 26 22  GLUCOSE 105* 114*  BUN 11 18  CREATININE 0.91 0.88  CALCIUM 8.9 9.8  MG -- --  PHOS -- --    Liver Function Tests: No results found for this basename: AST:5,ALT:5,ALKPHOS:5,BILITOT:5,PROT:5,ALBUMIN:5 in the last 168 hours No results found for this basename: LIPASE:5,AMYLASE:5 in the last 168 hours No results found for this basename: AMMONIA:3 in the last 168 hours  CBC:  Lab 03/18/12 0352 03/17/12 0837  WBC 15.2* 8.9  NEUTROABS -- --  HGB 14.6 16.0  HCT 42.6 45.9  MCV  88.4 86.4  PLT 235 249    Cardiac Enzymes:  Lab 03/18/12 0352 03/17/12 2121 03/17/12 1717  CKTOTAL 948* 1352* 1518*  CKMB 69.9* 105.3* 123.8*  CKMBINDEX -- -- --  TROPONINI >25.00* >25.00* >25.00*     BNP: BNP (last 3 results) No results found for this basename: PROBNP:3 in the last 8760 hours  ABG No results found for this basename: phart, pco2, pco2art, po2, po2art, hco3, tco2, acidbasedef, o2sat     Other results:  EKG: Sinus bradycardia with persistent V1 and V2 and Q waves waves.Minor ST elevation is persistent in V1 and V2.  No other significant EKG changes.  Imaging: Chest Portable 1 View  03/17/2012  *RADIOLOGY REPORT*  Clinical Data: Chest pain  PORTABLE CHEST - 1 VIEW  Comparison: None.  Findings: Artifact overlies chest.  Heart size is normal.  The vascularity is normal.  Lungs are clear.  No effusions.  No bony abnormalities.  IMPRESSION: No active disease  Original Report Authenticated By: Thomasenia Sales, M.D.      Medications:     Scheduled Medications:    . aspirin EC  81 mg Oral Daily  . atorvastatin  80 mg Oral q1800  . metoprolol tartrate  12.5 mg Oral BID  . prasugrel  10  mg Oral Daily  . sodium chloride  3 mL Intravenous Q12H     Infusions:     PRN Medications:  sodium chloride, acetaminophen, acetaminophen, diazepam, HYDROcodone-acetaminophen, morphine injection, nitroGLYCERIN, ondansetron (ZOFRAN) IV, ondansetron (ZOFRAN) IV, oxyCODONE-acetaminophen, sodium chloride   Assessment:   Non-ST elevation myocardial infarction Severe stenosis mid LAD and diagonal branch status post balloon angioplasty of the diagonal branch and drug-eluting stent x1 cm Mid LAD Subtotally occluded very small diagonal branch.  Too small for PCI Preserved LV function despite markedly increased troponin-ejection fraction determined by catheterization Elevated triglycerides Normal hemoglobin A1c Q waves in V1 and V2 with slightly persistent ST elevation in V1  and V2 Relative low blood pressure without orthostatic symptoms    Plan/Discussion:    Followup with Dr. Excell Seltzer in the next 7-10 days. Schedule outpatient echocardiogram to get accurate assessment of ejection fraction.  According to catheterization ejection fraction normal but out of proportion to the elevation in troponins. At the present time would continue current medical regimen which will include aspirin, prasugrel and beta blocker.  I would not add an ACE inhibitor at this point in time unless his ejection fraction is lower by the echocardiogram.  Also blood pressure is borderline at present time to add additional medications. Patient has been given instructions about hypertriglyceridemia and low HDL with weight reduction, increase exercise and reduction proportions of carbohydrates.  All patient's questions were answered prior to discharge his also been made aware if he has recurrent central chest pain prior to seeing Dr. Excell Seltzer he will need to come back to the hospital. Dual antiplatelet therapy for at least one year     Length of Stay: 2   [name] 03/19/2012, 8:38 AM

## 2012-03-19 NOTE — Discharge Instructions (Signed)
PLEASE REMEMBER TO BRING ALL OF YOUR MEDICATIONS TO EACH OF YOUR FOLLOW-UP OFFICE VISITS.  PLEASE ATTEND ALL SCHEDULED FOLLOW-UP APPOINTMENTS.   Activity: Increase activity slowly as tolerated. You may shower, but no soaking baths (or swimming) for 1 week. No driving for 2 days. No lifting over 5 lbs for 1 week. No sexual activity for 1 week.   You May Return to Work: in 1 week (if applicable)  Wound Care: You may wash cath site gently with soap and water. Keep cath site clean and dry. If you notice pain, swelling, bleeding or pus at your cath site, please call [phone].    Groin Site Care Refer to this sheet in the next few weeks. These instructions provide you with information on caring for yourself after your procedure. Your caregiver may also give you more specific instructions. Your treatment has been planned according to current medical practices, but problems sometimes occur. Call your caregiver if you have any problems or questions after your procedure. HOME CARE INSTRUCTIONS  You may shower 24 hours after the procedure. Remove the bandage (dressing) and gently wash the site with plain soap and water. Gently pat the site dry.   Do not apply powder or lotion to the site.   Do not sit in a bathtub, swimming pool, or whirlpool for 5 to 7 days.   No bending, squatting, or lifting anything over 10 pounds (4.5 kg) as directed by your caregiver.   Inspect the site at least twice daily.   Do not drive home if you are discharged the same day of the procedure. Have someone else drive you.   You may drive 24 hours after the procedure unless otherwise instructed by your caregiver.  What to expect:  Any bruising will usually fade within 1 to 2 weeks.   Blood that collects in the tissue (hematoma) may be painful to the touch. It should usually decrease in size and tenderness within 1 to 2 weeks.  SEEK IMMEDIATE MEDICAL CARE IF:  You have unusual pain at the groin site or down the  affected leg.   You have redness, warmth, swelling, or pain at the groin site.   You have drainage (other than a small amount of blood on the dressing).   You have chills.   You have a fever or persistent symptoms for more than 72 hours.   You have a fever and your symptoms suddenly get worse.   Your leg becomes pale, cool, tingly, or numb.   You have heavy bleeding from the site. Hold pressure on the site.  Document Released: 11/13/2010 Document Revised: 09/30/2011 Document Reviewed: 11/13/2010 [employer] Patient Information 2012 [employer].  

## 2012-03-19 NOTE — Discharge Summary (Signed)
CARDIOLOGY DISCHARGE SUMMARY   Patient ID: [name] MRN: [id number] DOB/AGE: November 29, 1965 46 y.o.  Admit date: 03/17/2012 Discharge date: 03/19/2012  Primary Discharge Diagnosis:  NSTEMI Secondary Discharge Diagnosis:  Past Medical History  Diagnosis Date  . Low back pain   . Tobacco abuse     a. 1 1/2 ppd x 20 yrs (02/2012)  . Obesity   . Hypertension    Procedure Performed:  1. Left Heart Catheterization 2. Selective Coronary Angiography 3. Left ventricular angiogram 4. PTCA/balloon angioplasty Diagonal branch 5. PTCA/DES x 1 mid LAD - 3.0 x 28 mm Promus Element DES 6. IVUS mid LAD  Hospital Course: Mr. Ranieri is a 46 year old male with no previous history of coronary artery disease. He had chest pain which did not resolve and came to the hospital. His chest pain was ongoing and in the emergency room, his initial troponin was elevated. He was taken directly to the Cath Lab.  The cardiac catheterization results are listed below. The LAD was stented with balloon angioplasty to the diagonal branch. A small diagonal branch was subtotally occluded and medical therapy was recommended.  He was seen by cardiac rehabilitation and counseled on a heart healthy lifestyle as well as smoking cessation. A 2-D echocardiogram was performed but the results are pending. His blood pressure was borderline but he is tolerating a low-dose beta blocker. A statin was added to his medication regimen as well. He is on dual anti--platelet therapy. No ace inhibitor was added because of borderline blood pressure. This can be considered as an outpatient.  He improved steadily and by 03/19/2012, he was ambulating without chest pain or shortness of breath. Dr. Andee Lineman evaluated him and considered him stable for discharge, to followup as an outpatient.  Labs:   Lab Results  Component Value Date   WBC 15.2* 03/18/2012   HGB 14.6 03/18/2012   HCT 42.6 03/18/2012   MCV 88.4 03/18/2012   PLT 235 03/18/2012      Lab 03/18/12 0352  NA 138  K 4.1  CL 102  CO2 26  BUN 11  CREATININE 0.91  CALCIUM 8.9  PROT --  BILITOT --  ALKPHOS --  ALT --  AST --  GLUCOSE 105*    Basename 03/18/12 0352 03/17/12 2121 03/17/12 1717  CKTOTAL 948* 1352* 1518*  CKMB 69.9* 105.3* 123.8*  CKMBINDEX -- -- --  TROPONINI >25.00* >25.00* >25.00*   Lipid Panel     Component Value Date/Time   CHOL 166 03/18/2012 0352   TRIG 347* 03/18/2012 0352   HDL 27* 03/18/2012 0352   CHOLHDL 6.1 03/18/2012 0352   VLDL 69* 03/18/2012 0352   LDLCALC 70 03/18/2012 0352    Basename 03/17/12 1718  INR 0.92      Radiology: Chest Portable 1 View 03/17/2012  *RADIOLOGY REPORT*  Clinical Data: Chest pain  PORTABLE CHEST - 1 VIEW  Comparison: None.  Findings: Artifact overlies chest.  Heart size is normal.  The vascularity is normal.  Lungs are clear.  No effusions.  No bony abnormalities.  IMPRESSION: No active disease  Original Report Authenticated By: Thomasenia Sales, M.D.    Cardiac Cath:  Left main: No obstructive disease noted.  Left Anterior Descending Artery: Large caliber vessel that courses to the apex. 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 The mid vessel has hazy 70-80% stenosis. The distal vessel has no disease. The first early diagonal branch is very small (1.5 mm) and fills slowly,  sub-total occlusion. The second diagonal branch is moderate sized, bifurcating vessel with proximal 80% stenosis.  Circumflex Artery: Large caliber vessel with no obstructive disease. The obtuse marginal branch is large and has no disease.  Right Coronary Artery: Large, dominant vessel with diffuse 30% plaque throughout the mid vessel.  Left Ventricular Angiogram: LVEF 55%.  Impression:  1. NSTEMI  2. Severe stenosis mid LAD and Diagonal branch, now s/p balloon angioplasty of diagonal branch and a 3.0 x 28 mm Promus Element DES was deployed in the mid LAD 3. Subtotally occluded very small early  diagonal branch which may be culprit for his ongoing pain. Too small for PCI  4. Preserved LV systolic function   EKG: 19-Mar-2012 08:25:23  Sinus bradycardia Incomplete right bundle branch block Vent. rate 59 BPM PR interval 166 ms QRS duration 108 ms QT/QTc 414/409 ms P-R-T axes 57 65 80  FOLLOW UP PLANS AND APPOINTMENTS No Known Allergies Medication List  As of 03/19/2012 10:46 AM   TAKE these medications         aspirin 81 MG EC tablet   Take 1 tablet (81 mg total) by mouth daily.      atorvastatin 80 MG tablet   Commonly known as: LIPITOR   Take 1 tablet (80 mg total) by mouth daily at 6 PM.      HYDROCODONE-ACETAMINOPHEN PO   Take 1 tablet by mouth at bedtime as needed. For back pain      metoprolol succinate 25 MG 24 hr tablet   Commonly known as: TOPROL-XL   Take 1 tablet (25 mg total) by mouth daily. Take with or immediately following a meal.      nitroGLYCERIN 0.4 MG SL tablet   Commonly known as: NITROSTAT   Place 1 tablet (0.4 mg total) under the tongue every 5 (five) minutes as needed for chest pain.      prasugrel 10 MG Tabs   Commonly known as: EFFIENT   Take 1 tablet (10 mg total) by mouth daily.      terbinafine 250 MG tablet   Commonly known as: LAMISIL   Take 250 mg by mouth every evening.            Discharge Orders    Future Orders Please Complete By Expires   Amb Referral to Cardiac Rehabilitation        Follow-up Information    Follow up with Tonny Bollman, MD.  The office will call.   Contact information:   [address], [address] [place] [address] [phone]       Follow up with Crawford Givens, MD. (As needed)    Contact information:   [address] [place] [address] [phone]          BRING ALL MEDICATIONS WITH YOU TO FOLLOW UP APPOINTMENTS  Time spent with patient to include physician time: 40 min Signed: Theodore Demark 03/19/2012, 10:46  AM Co-Sign MD

## 2012-03-19 NOTE — Progress Notes (Signed)
  Echocardiogram 2D Echocardiogram has been performed.  Jeweldean Drohan L 03/19/2012, 10:14 AM

## 2012-03-20 MED FILL — Dextrose Inj 5%: INTRAVENOUS | Qty: 50 | Status: AC

## 2012-03-20 MED FILL — Nicardipine HCl IV Soln 2.5 MG/ML: INTRAVENOUS | Qty: 1 | Status: AC

## 2012-03-24 NOTE — Discharge Summary (Signed)
Patient seen and examined with PA  Counseling was provided regarding the current medical condition and included: . Diagnosis, impressions, prognosis, recommended diagnostic studies  . Risks and benefits of treatment options  . Instructions for management, treatment and/or follow-up care  . Importance of compliance with treatment, risk factor reduction  . Patient and/or family education    Time spent counseling was 15 minutes and recorded in the Problem List documeted by Gene Serpe , PA-C

## 2012-03-28 ENCOUNTER — Encounter: Payer: Self-pay | Admitting: *Deleted

## 2012-04-03 ENCOUNTER — Encounter: Payer: Self-pay | Admitting: Nurse Practitioner

## 2012-04-03 ENCOUNTER — Encounter: Payer: Self-pay | Admitting: Physician Assistant

## 2012-04-03 ENCOUNTER — Ambulatory Visit (INDEPENDENT_AMBULATORY_CARE_PROVIDER_SITE_OTHER): Payer: Self-pay | Admitting: Nurse Practitioner

## 2012-04-03 VITALS — BP 103/61 | HR 54 | Ht 71.0 in | Wt 243.2 lb

## 2012-04-03 DIAGNOSIS — I2510 Atherosclerotic heart disease of native coronary artery without angina pectoris: Secondary | ICD-10-CM

## 2012-04-03 DIAGNOSIS — F17200 Nicotine dependence, unspecified, uncomplicated: Secondary | ICD-10-CM

## 2012-04-03 MED ORDER — PRASUGREL HCL 10 MG PO TABS: 10.0000 mg | ORAL_TABLET | Freq: Every day | ORAL | Status: DC

## 2012-04-03 NOTE — Patient Instructions (Signed)
Continue with your current medicines.  You may take the Metoprolol at bedtime if you have more dizziness in the daytime  You may return to work next week - light duty for 2 to 3 weeks  Here are my tips to lose weight:  1. Drink only water. You do not need milk, juice, tea, soda or diet soda.  2. Do not eat anything "white". This includes white bread, potatoes, rice or mayo  3. Stay away from fried foods and sweets  4. Your portion should be the size of the palm of your hand.  5. Know what your weaknesses are and avoid.  6. Find an exercise you like and do it every day for 45 to 60 minutes.       Walking every day - start slow and work towards a goal of 45 to 60 minutes a day  Congratulations for not smoking!!  I will have you see Dr. Excell Seltzer in one month with fasting labs  Call the [facility] office at [phone] if you have any questions, problems or concerns.

## 2012-04-03 NOTE — Progress Notes (Signed)
[name] Date of Birth: 01-14-1966 Medical Record #[id number]  History of Present Illness: [name] is seen back today for a post hospital visit. He is seen for Dr. Excell Seltzer. He has had a recent NSTEMI with early presentation. He had balloon PCI to the DX and DES to the mid LAD. His EF was normal by cath as well as echo. He is committed to Effient for one year. His other problems include HLD and tobacco abuse.  He comes in today. He is here alone. He seems to be doing well. Admits that he is pretty scared with the recent events. No more chest pain. No problems with his radial cath site. Does get a little dizzy and blood pressure is on the low side of normal. No syncope. Trying to eat better. Has stopped smoking. He is currently not working and has no insurance or drug coverage. He is anxious to return to work in order to get his medicines and pay his bills.   Current Outpatient Prescriptions on File Prior to Visit  Medication Sig Dispense Refill  . atorvastatin (LIPITOR) 80 MG tablet Take 1 tablet (80 mg total) by mouth daily at 6 PM.  30 tablet  11  . HYDROCODONE-ACETAMINOPHEN PO Take 1 tablet by mouth at bedtime as needed. For back pain      . metoprolol succinate (TOPROL XL) 25 MG 24 hr tablet Take 1 tablet (25 mg total) by mouth daily. Take with or immediately following a meal.  30 tablet  11  . nitroGLYCERIN (NITROSTAT) 0.4 MG SL tablet Place 1 tablet (0.4 mg total) under the tongue every 5 (five) minutes as needed for chest pain.  25 tablet  3  . terbinafine (LAMISIL) 250 MG tablet Take 250 mg by mouth every evening.      Marland Kitchen aspirin EC 81 MG EC tablet Take 1 tablet (81 mg total) by mouth daily.        No Known Allergies  Past Medical History  Diagnosis Date  . Low back pain   . Tobacco abuse     a. 1 1/2 ppd x 20 yrs (02/2012)  . Obesity   . Hypertension   . CAD (coronary artery disease) 03-17-2012    S/P NSTEMI with PCI balloon angioplasty diagonal and DES x 1 mid LAD;  normal LV function    Past Surgical History  Procedure Date  . Appendectomy   . Coronary stent placement 02/2012    balloon PCI to DX; DES to mid LAD    History  Smoking status  . Former Smoker -- 1.5 packs/day for 20 years  . Types: Cigarettes  . Quit date: 03/19/2012  Smokeless tobacco  . Never Used    History  Alcohol Use No    former    Family History  Problem Relation Age of Onset  . Heart attack Father     3 mi's - first in 34's.  died @ 61.  Marland Kitchen Hypertension Mother     alive  . Other      2 brothers alive & well    Review of Systems: The review of systems is per the HPI.  All other systems were reviewed and are negative.  Physical Exam: BP 103/61  Pulse 54  Ht 5\' 11"  (1.803 m)  Wt 243 lb 3.2 oz (110.315 kg)  BMI 33.92 kg/m2  SpO2 98% Patient is very pleasant and in no acute distress. He is obese. Skin is warm and dry. Color is normal.  HEENT is unremarkable. Normocephalic/atraumatic. PERRL. Sclera are nonicteric. Neck is supple. No masses. No JVD. Lungs are clear. Cardiac exam shows a regular rate and rhythm. Abdomen is obese but soft. Extremities are without edema. Gait and ROM are intact. No gross neurologic deficits noted.   LABORATORY DATA: Lab Results  Component Value Date   WBC 15.2* 03/18/2012   HGB 14.6 03/18/2012   HCT 42.6 03/18/2012   PLT 235 03/18/2012   GLUCOSE 105* 03/18/2012   CHOL 166 03/18/2012   TRIG 347* 03/18/2012   HDL 27* 03/18/2012   LDLCALC 70 03/18/2012   NA 138 03/18/2012   K 4.1 03/18/2012   CL 102 03/18/2012   CREATININE 0.91 03/18/2012   BUN 11 03/18/2012   CO2 26 03/18/2012   TSH 0.673 03/17/2012   PSA 0.6 01/27/1996   INR 0.92 03/17/2012   HGBA1C 5.2 03/17/2012    Echo Study Conclusions May 2013  - Left ventricle: The cavity size was mildly dilated. Wall thickness was increased in a pattern of mild LVH. Systolic function was normal. The estimated ejection fraction was in the range of 55% to 60%. Wall motion was normal;  there were no regional wall motion abnormalities. Left ventricular diastolic function parameters were normal. - Right atrium: Central venous pressure: 10mm Hg (est). - Pericardium, extracardiac: There was no pericardial effusion.  Cardiac Cath Impression:  1. NSTEMI  2. 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  3. Subtotally occluded very small early diagonal branch which may be culprit for his ongoing pain. Too small for PCI  4. Preserved LV systolic function   Recommendations: He will be continued on ASA and Effient for one year   Assessment / Plan:

## 2012-04-03 NOTE — Assessment & Plan Note (Signed)
He is not smoking. He is congratulated.  

## 2012-04-03 NOTE — Assessment & Plan Note (Signed)
Patient has had recent NSTEMI with PCI/balloon angioplasty to the DX and DES to the mid LAD. He did present early and has normal EF by cath and echo despite troponins of over 25.  He is on Effient for one year. He is going to have issues with cost. He is in the process of working on his paperwork for assistance from [employer]. I have given him samples. We have reviewed his diet/exercise and activity level. I will let him return to work next week for light duty for 2 to 3 weeks. He needs to get some income coming back in . He cleans apartments and offices. He is congratulated for not smoking. We will see him back in a month with fasting labs. Patient is agreeable to this plan and will call if any problems develop in the interim.

## 2012-05-16 ENCOUNTER — Telehealth: Payer: Self-pay

## 2012-05-16 NOTE — Telephone Encounter (Signed)
Give samples if we have them.  He'll need CPE this fall.

## 2012-05-16 NOTE — Telephone Encounter (Signed)
Pt last seen 10/22/09 by Dr Hetty Ely; no future appt scheduled. Pt recently seen cardiology by Norma Fredrickson NP, cardiology did not have samples of Effient. Pt has Effient to last until Fri and does not have funds to get refill. Pt request samples from our office. Pt said would schedule appt with Dr Para March after finishing with [facility] cardiology.Please advise.

## 2012-05-16 NOTE — Telephone Encounter (Signed)
Patient advised that we do not have the samples and reminded to schedule CPE this Fall.

## 2012-05-26 ENCOUNTER — Ambulatory Visit (INDEPENDENT_AMBULATORY_CARE_PROVIDER_SITE_OTHER): Payer: Self-pay | Admitting: Cardiovascular Disease

## 2012-05-26 VITALS — BP 100/60 | HR 52 | Resp 17

## 2012-05-26 DIAGNOSIS — I2510 Atherosclerotic heart disease of native coronary artery without angina pectoris: Secondary | ICD-10-CM

## 2012-05-26 MED ORDER — CLOPIDOGREL BISULFATE 75 MG PO TABS: 75.0000 mg | ORAL_TABLET | Freq: Every day | ORAL | Status: DC

## 2012-05-26 NOTE — Patient Instructions (Addendum)
Your physician recommends that you schedule a follow-up appointment in: 4 months with [name] (labs one week prior to appt)  Your physician has recommended you make the following change in your medication: STOP your Toprol.  STOP your Effient when you run out and start Plavix 75mg  the next day.  Your physician recommends that you return for lab work in: one week prior to your visit with [name] (lipid, liver)

## 2012-05-30 ENCOUNTER — Encounter: Payer: Self-pay | Admitting: Cardiovascular Disease

## 2012-05-30 NOTE — Progress Notes (Signed)
   HPI:  46 year-old male with CAD who presented in May 2013 with NSTEMI and was found to have critical LAD stenosis. He was treated with a DES to the LAD and balloon angioplasty of the diagonal. LVEF was normal. He presents today for follow-up evaluation.  He doesn't feel well. He complains of weakness, fatigue, and lightheadedness. No chest pain, chest pressure, or dyspnea. No symptoms like those of his MI. He is concerned that his meds are causing his symptoms.  Outpatient Encounter Prescriptions as of 05/26/2012  Medication Sig Dispense Refill  . aspirin EC 81 MG EC tablet Take 1 tablet (81 mg total) by mouth daily.      Marland Kitchen atorvastatin (LIPITOR) 80 MG tablet Take 1 tablet (80 mg total) by mouth daily at 6 PM.  30 tablet  11  . HYDROCODONE-ACETAMINOPHEN PO Take 1 tablet by mouth at bedtime as needed. For back pain      . nitroGLYCERIN (NITROSTAT) 0.4 MG SL tablet Place 1 tablet (0.4 mg total) under the tongue every 5 (five) minutes as needed for chest pain.  25 tablet  3  . terbinafine (LAMISIL) 250 MG tablet Take 250 mg by mouth every evening.      Marland Kitchen DISCONTD: metoprolol succinate (TOPROL XL) 25 MG 24 hr tablet Take 1 tablet (25 mg total) by mouth daily. Take with or immediately following a meal.  30 tablet  11  . DISCONTD: prasugrel (EFFIENT) 10 MG TABS Take 1 tablet (10 mg total) by mouth daily.  30 tablet  0  . clopidogrel (PLAVIX) 75 MG tablet Take 1 tablet (75 mg total) by mouth daily.  90 tablet  3    No Known Allergies  Past Medical History  Diagnosis Date  . Low back pain   . Tobacco abuse     a. 1 1/2 ppd x 20 yrs (02/2012)  . Obesity   . Hypertension   . CAD (coronary artery disease) 03-17-2012    S/P NSTEMI with PCI balloon angioplasty diagonal and DES x 1 mid LAD; normal LV function    ROS: Negative except as per HPI  BP 100/60  Pulse 52  Resp 17  SpO2 93%  PHYSICAL EXAM: Pt is alert and oriented, NAD HEENT: normal Neck: JVP - normal, carotids 2+= without  bruits Lungs: CTA bilaterally CV: RRR without murmur or gallop Abd: soft, NT, Positive BS, no hepatomegaly Ext: no C/C/E, distal pulses intact and equal Skin: warm/dry no rash  EKG:  Sinus brady 52 bpm, RV conduction delay.  ASSESSMENT AND PLAN:

## 2012-05-30 NOTE — Assessment & Plan Note (Signed)
Stable without angina, but not tolerating medical therapy well. Since LV function is preserved and he's having no angina, I recommended that he stop Toprol XL. He needs to continue ASA, Effient, and lipitor. Effient cost him almost $300 dollars last month and he did not qualify for the assistance program. We have no samples to give him. I have recommended that he change to plavix when he runs out of his current Effient supply. He can switch from one day to the next, i.e. Take last Effient Monday and start plavix 75 mg Tuesday. I would like him to follow-up with Norma Fredrickson in 4 months.

## 2012-10-03 ENCOUNTER — Other Ambulatory Visit (INDEPENDENT_AMBULATORY_CARE_PROVIDER_SITE_OTHER): Payer: Self-pay

## 2012-10-03 DIAGNOSIS — I2510 Atherosclerotic heart disease of native coronary artery without angina pectoris: Secondary | ICD-10-CM

## 2012-10-03 LAB — LIPID PANEL
Total CHOL/HDL Ratio: 3
Triglycerides: 112 mg/dL (ref 0.0–149.0)

## 2012-10-03 LAB — HEPATIC FUNCTION PANEL
ALT: 31 U/L (ref 0–53)
AST: 23 U/L (ref 0–37)
Bilirubin, Direct: 0.1 mg/dL (ref 0.0–0.3)
Total Protein: 7 g/dL (ref 6.0–8.3)

## 2012-10-10 ENCOUNTER — Ambulatory Visit (INDEPENDENT_AMBULATORY_CARE_PROVIDER_SITE_OTHER): Payer: Self-pay | Admitting: Nurse Practitioner

## 2012-10-10 ENCOUNTER — Encounter: Payer: Self-pay | Admitting: Nurse Practitioner

## 2012-10-10 VITALS — BP 110/64 | HR 72 | Ht 71.0 in | Wt 221.1 lb

## 2012-10-10 DIAGNOSIS — I259 Chronic ischemic heart disease, unspecified: Secondary | ICD-10-CM

## 2012-10-10 MED ORDER — ATORVASTATIN CALCIUM 80 MG PO TABS: 40.0000 mg | ORAL_TABLET | Freq: Every day | ORAL | Status: DC

## 2012-10-10 NOTE — Progress Notes (Signed)
[name] Date of Birth: [birth year] Medical Record #[id number]  History of Present Illness: [name] is seen today for a 4 month check. He is seen for Dr. Excell Seltzer. He has had NSTEMI back in may treated with DES to the LAD and balloon angioplasty of the diagonal. His LV function was normal. He did not tolerate beta blocker therapy due to fatigue, weakness and lightheadedness.   He comes in today. He is here alone. He is doing ok. Not smoking. Had labs yesterday and his Lipitor has been cut back to 40 mg. He has had a nice response with lowering his LDL. He does have some aching of his joints at times. No chest pain. Not walking as much as he should. Not smoking. Has some back pain that limits him at times. Overall, he feels like he is doing ok.   Current Outpatient Prescriptions on File Prior to Visit  Medication Sig Dispense Refill  . aspirin EC 81 MG EC tablet Take 1 tablet (81 mg total) by mouth daily.      Marland Kitchen atorvastatin (LIPITOR) 80 MG tablet Take 0.5 tablets (40 mg total) by mouth daily at 6 PM.  30 tablet  11  . clopidogrel (PLAVIX) 75 MG tablet Take 1 tablet (75 mg total) by mouth daily.  90 tablet  3  . nitroGLYCERIN (NITROSTAT) 0.4 MG SL tablet Place 1 tablet (0.4 mg total) under the tongue every 5 (five) minutes as needed for chest pain.  25 tablet  3    No Known Allergies  Past Medical History  Diagnosis Date  . Low back pain   . Tobacco abuse     a. 1 1/2 ppd x 20 yrs (02/2012)  . Obesity   . Hypertension   . CAD (coronary artery disease) 03-17-2012    S/P NSTEMI with PCI balloon angioplasty diagonal and DES x 1 mid LAD; normal LV function    Past Surgical History  Procedure Date  . Appendectomy   . Coronary stent placement 02/2012    balloon PCI to DX; DES to mid LAD    History  Smoking status  . Former Smoker -- 1.5 packs/day for 20 years  . Types: Cigarettes  . Quit date: 03/19/2012  Smokeless tobacco  . Never Used    History  Alcohol Use No   Comment: former    Family History  Problem Relation Age of Onset  . Heart attack Father     3 mi's - first in 61's.  died @ 59.  Marland Kitchen Hypertension Mother     alive  . Other      2 brothers alive & well    Review of Systems: The review of systems is per the HPI.  All other systems were reviewed and are negative.  Physical Exam: BP 110/64  Pulse 72  Ht 5\' 11"  (1.803 m)  Wt 221 lb 1.9 oz (100.299 kg)  BMI 30.84 kg/m2 Patient is very pleasant and in no acute distress. Skin is warm and dry. Color is normal.  HEENT is unremarkable. Normocephalic/atraumatic. PERRL. Sclera are nonicteric. Neck is supple. No masses. No JVD. Lungs are clear. Cardiac exam shows a regular rate and rhythm. Abdomen is soft. Extremities are without edema. Gait and ROM are intact. No gross neurologic deficits noted.   LABORATORY DATA:  Lab Results  Component Value Date   WBC 15.2* 03/18/2012   HGB 14.6 03/18/2012   HCT 42.6 03/18/2012   PLT 235 03/18/2012   GLUCOSE 105*  03/18/2012   CHOL 80 10/03/2012   TRIG 112.0 10/03/2012   HDL 30.20* 10/03/2012   LDLCALC 27 10/03/2012   ALT 31 10/03/2012   AST 23 10/03/2012   NA 138 03/18/2012   K 4.1 03/18/2012   CL 102 03/18/2012   CREATININE 0.91 03/18/2012   BUN 11 03/18/2012   CO2 26 03/18/2012   TSH 0.673 03/17/2012   PSA 0.6 01/27/1996   INR 0.92 03/17/2012   HGBA1C 5.2 03/17/2012     Assessment / Plan:  1. CAD - prior NSTEMI with DES to the LAD - on Plavix. No recurrent symptoms. Doing well. Encouraged daily walking.   2. HLD - had lipids yesterday - reported to the patient. Have cut the Lipitor back to 40 mg a day. He may see some improvement in his joint aching and I have told him that if it persists or gets to the point where it is more bothersome to call us and we can try a different agent.   3. Tobacco abuse - hopefully resolved. Currently not smoking.   Overall, he is doing well. I will have him see Dr. Excell Seltzer in 6 months with fasting labs.  Patient is  agreeable to this plan and will call if any problems develop in the interim.

## 2012-10-10 NOTE — Patient Instructions (Addendum)
I think you are doing well.  Walking is strongly encouraged  Stay on your current medicines but cut the Lipitor back to just 40 mg (half a tablet)  See Dr. Excell Seltzer in 6 months  Call the [facility] office at [phone] if you have any questions, problems or concerns.

## 2012-10-16 ENCOUNTER — Ambulatory Visit (INDEPENDENT_AMBULATORY_CARE_PROVIDER_SITE_OTHER): Payer: Self-pay | Admitting: Family Medicine

## 2012-10-16 ENCOUNTER — Encounter: Payer: Self-pay | Admitting: Family Medicine

## 2012-10-16 VITALS — BP 110/70 | HR 84 | Temp 98.5°F | Wt 223.0 lb

## 2012-10-16 DIAGNOSIS — M549 Dorsalgia, unspecified: Secondary | ICD-10-CM

## 2012-10-16 MED ORDER — NITROGLYCERIN 0.4 MG SL SUBL: 0.4000 mg | SUBLINGUAL_TABLET | SUBLINGUAL | Status: DC | PRN

## 2012-10-16 MED ORDER — HYDROCODONE-ACETAMINOPHEN 5-325 MG PO TABS: 1.0000 | ORAL_TABLET | Freq: Four times a day (QID) | ORAL | Status: DC | PRN

## 2012-10-16 MED ORDER — METHOCARBAMOL 500 MG PO TABS: 500.0000 mg | ORAL_TABLET | Freq: Four times a day (QID) | ORAL | Status: DC

## 2012-10-16 NOTE — Assessment & Plan Note (Signed)
With radicular sx.  No need to image today, but he is uncomfortable.   Use the hydrocodone for pain and robaxin for muscle spasms. Don't take extra tylenol or aleve/ibuprofen.  Use a heating pad as needed.  The meds can make you drowsy and constipated. Use a stool softener as needed.  He agrees with plan.  F/u prn.

## 2012-10-16 NOTE — Progress Notes (Signed)
Pain started about 3 days ago.  Worse each day in the meantime.  R>L lower back pain and radicular pain.  Pain with walking but able to bear weight.  Girlfriend had to help him out bed today.  Feet and legs aren't numb.  No heavy lifting, no falls, no trauma.  No clear trigger.  Pain twisting or leaning over.  Pain worst sitting, standing hurts the least of any position.  H/o similar years ago. Prev had seen sports med clinic, years ago.    Meds, vitals, and allergies reviewed.   ROS: See HPI.  Otherwise, noncontributory.  nad but uncomfortable.  Walks with limp nad rrr ctab Back w/o midline pain.  ttp in R>L lower back, esp near R SI joint.  No rash Pain on R>L SI testing.  SLR not done due to patient discomfort.  S/S/DTR wnl in BLE

## 2012-10-16 NOTE — Patient Instructions (Addendum)
Use the hydrocodone for pain and robaxin for muscle spasms.  Don't take extra tylenol or aleve/ibuprofen.  Use a heating pad as needed.  The meds can make you drowsy and constipated.  Use a stool softener as needed.  Take care.

## 2012-10-19 ENCOUNTER — Emergency Department: Payer: Self-pay | Admitting: Emergency Medicine

## 2012-10-24 ENCOUNTER — Ambulatory Visit (INDEPENDENT_AMBULATORY_CARE_PROVIDER_SITE_OTHER)
Admission: RE | Admit: 2012-10-24 | Discharge: 2012-10-24 | Disposition: A | Discharge status: Home / Self Care | Payer: Self-pay | Source: Ambulatory Visit | Attending: Family Medicine | Admitting: Family Medicine

## 2012-10-24 ENCOUNTER — Encounter: Payer: Self-pay | Admitting: Family Medicine

## 2012-10-24 ENCOUNTER — Telehealth: Payer: Self-pay | Admitting: Family Medicine

## 2012-10-24 ENCOUNTER — Ambulatory Visit (INDEPENDENT_AMBULATORY_CARE_PROVIDER_SITE_OTHER): Payer: Self-pay | Admitting: Family Medicine

## 2012-10-24 VITALS — BP 110/68 | HR 84 | Temp 98.3°F | Ht 71.0 in | Wt 221.0 lb

## 2012-10-24 DIAGNOSIS — M549 Dorsalgia, unspecified: Secondary | ICD-10-CM

## 2012-10-24 DIAGNOSIS — R82998 Other abnormal findings in urine: Secondary | ICD-10-CM

## 2012-10-24 DIAGNOSIS — R8290 Unspecified abnormal findings in urine: Secondary | ICD-10-CM

## 2012-10-24 DIAGNOSIS — R319 Hematuria, unspecified: Secondary | ICD-10-CM | POA: Insufficient documentation

## 2012-10-24 LAB — POCT URINALYSIS DIPSTICK
Bilirubin, UA: NEGATIVE
Glucose, UA: NEGATIVE
Leukocytes, UA: NEGATIVE
Nitrite, UA: NEGATIVE

## 2012-10-24 LAB — POCT UA - MICROSCOPIC ONLY

## 2012-10-24 NOTE — Progress Notes (Signed)
Subjective:    Patient ID: [name], male    DOB: 03/06/1966, 46 y.o.   MRN: [id number]  HPI Here for back pain - was here recently and not getting better  Micah Flesher to [facility] ER on Friday- and got a steroid shot -- and that did help quite a bit - temporary  Now is on predinisone - 60 mg taper? Not sure- still on it  Told he needed an MRI   Is hurting in low back- worse on there R side and into the buttock area - rad to the legs/ esp on the R  No numbness or weakness in lower extrem- but hurts to walk  Some relief if he lies down on L side with pillow between knees   No urinary symptoms- but told at ER he could also have a kidney stone, so he would like his urine checked  They did not do xrays or ua in ER per pt    Patient Active Problem List  Diagnosis  . GENITAL HERPES  . TOBACCO ABUSE  . GENITAL HERPES, HX OF  . CAD (coronary artery disease)  . Back pain   Past Medical History  Diagnosis Date  . Low back pain   . Tobacco abuse     a. 1 1/2 ppd x 20 yrs (02/2012)  . Obesity   . Hypertension   . CAD (coronary artery disease) 03-17-2012    S/P NSTEMI with PCI balloon angioplasty diagonal and DES x 1 mid LAD; normal LV function   Past Surgical History  Procedure Date  . Appendectomy   . Coronary stent placement 02/2012    balloon PCI to DX; DES to mid LAD   History  Substance Use Topics  . Smoking status: Former Smoker -- 1.5 packs/day for 20 years    Types: Cigarettes    Quit date: 03/19/2012  . Smokeless tobacco: Never Used  . Alcohol Use: No     Comment: former   Family History  Problem Relation Age of Onset  . Heart attack Father     3 mi's - first in 50's.  died @ 58.  Marland Kitchen Hypertension Mother     alive  . Other      2 brothers alive & well   Allergies  Allergen Reactions  . Amoxicillin     Rash    Current Outpatient Prescriptions on File Prior to Visit  Medication Sig Dispense Refill  . aspirin EC 81 MG EC tablet Take 1 tablet (81 mg total) by  mouth daily.      Marland Kitchen atorvastatin (LIPITOR) 80 MG tablet Take 0.5 tablets (40 mg total) by mouth daily at 6 PM.  30 tablet  11  . clopidogrel (PLAVIX) 75 MG tablet Take 1 tablet (75 mg total) by mouth daily.  90 tablet  3  . HYDROcodone-acetaminophen (NORCO/VICODIN) 5-325 MG per tablet Take 1-2 tablets by mouth every 6 (six) hours as needed for pain.  40 tablet  0  . methocarbamol (ROBAXIN) 500 MG tablet Take 1-2 tablets (500-1,000 mg total) by mouth 4 (four) times daily.  40 tablet  1  . nitroGLYCERIN (NITROSTAT) 0.4 MG SL tablet Place 1 tablet (0.4 mg total) under the tongue every 5 (five) minutes as needed for chest pain.  25 tablet  5        Review of Systems Review of Systems  Constitutional: Negative for fever, appetite change, fatigue and unexpected weight change.  Eyes: Negative for pain and visual disturbance.  Respiratory: Negative for cough and shortness of breath.   Cardiovascular: Negative for cp or palpitations    Gastrointestinal: Negative for nausea, diarrhea and constipation.  Genitourinary: Negative for urgency and frequency. neg for blood in urine or flank pain  Skin: Negative for pallor or rash   MSK pos for back pain with ref to legs, neg for joint redness or swelling  Neurological: Negative for weakness, light-headedness, numbness and headaches.  Hematological: Negative for adenopathy. Does not bruise/bleed easily.  Psychiatric/Behavioral: Negative for dysphoric mood. The patient is not nervous/anxious.         Objective:   Physical Exam  Constitutional: He appears well-developed and well-nourished. No distress.       In discomfort with movement  HENT:  Head: Normocephalic and atraumatic.  Eyes: Conjunctivae normal and EOM are normal. Pupils are equal, round, and reactive to light.  Neck: Normal range of motion. Neck supple.  Cardiovascular: Normal rate and regular rhythm.   Pulmonary/Chest: Effort normal and breath sounds normal.  Abdominal: Soft. Bowel  sounds are normal. He exhibits no distension and no mass. There is no tenderness.       No suprapubic tenderness or fullness    Musculoskeletal: He exhibits tenderness. He exhibits no edema.       Lumbar back: He exhibits decreased range of motion, tenderness, pain and spasm. He exhibits no bony tenderness, no swelling, no edema, no deformity and no laceration.       Tender over R paralumbar musculature and also in SI area  Pos SLR for back and buttock pain but not leg pain  Flex 20 deg/ ext 10 deg LS with significant pain  Gait is slow and labored due to pain  Pt has difficulty getting out of chair No foot drop  No cva tenderness   Neurological: He is alert. He has normal strength and normal reflexes. He displays no atrophy. No sensory deficit. He exhibits normal muscle tone. Coordination normal.  Skin: Skin is warm and dry. No rash noted. No erythema.  Psychiatric: He has a normal mood and affect.          Assessment & Plan:

## 2012-10-24 NOTE — Patient Instructions (Addendum)
Xray today Urinalysis today Will update with results Continue current medicines Heat - 10 minutes at  Time - and keep walking  If worse please update [place]

## 2012-10-24 NOTE — Telephone Encounter (Signed)
CT scan scheduled today at 3:30 at [facility] patient aware and call report requested.

## 2012-10-24 NOTE — Assessment & Plan Note (Signed)
Positional with radicular symptoms- brief imp after steroid inj at [facility] on fri (now on pred taper- he thinks) Pain is worsening again  xr LS now and also ua at pt request  Pt was told in ER that he would need MRI- will wait on XR result first - if neg perhaps consider PT as well

## 2012-10-24 NOTE — Telephone Encounter (Signed)
Please let pt know that his back xray was normal appearing but urine does have some red and white blood cells in it - I cannot rule out either urine infection or kidney stone I am going to go ahead and refer him for a CT scan to rule out kidney stone and also send urine for culture to see if there is infection Will forward to Shirlee Limerick also so she is aware

## 2012-10-26 LAB — URINE CULTURE
Colony Count: NO GROWTH
Organism ID, Bacteria: NO GROWTH

## 2012-10-26 NOTE — Assessment & Plan Note (Signed)
Rbc on dip and leukocytes on micro Sent for cx Sent for CT of abd / pelvis to r/o kidney stone Given strainer to urinate through

## 2012-11-01 ENCOUNTER — Ambulatory Visit: Payer: Self-pay | Admitting: Family Medicine

## 2012-11-08 ENCOUNTER — Encounter: Payer: Self-pay | Admitting: Family Medicine

## 2012-11-08 ENCOUNTER — Ambulatory Visit (INDEPENDENT_AMBULATORY_CARE_PROVIDER_SITE_OTHER): Payer: Self-pay | Admitting: Family Medicine

## 2012-11-08 VITALS — BP 102/60 | HR 76 | Temp 98.2°F | Wt 222.0 lb

## 2012-11-08 DIAGNOSIS — R319 Hematuria, unspecified: Secondary | ICD-10-CM

## 2012-11-08 DIAGNOSIS — M549 Dorsalgia, unspecified: Secondary | ICD-10-CM

## 2012-11-08 LAB — POCT URINALYSIS DIPSTICK
Bilirubin, UA: NEGATIVE
Glucose, UA: NEGATIVE
Leukocytes, UA: NEGATIVE
Nitrite, UA: NEGATIVE
Urobilinogen, UA: NEGATIVE

## 2012-11-08 MED ORDER — METHOCARBAMOL 500 MG PO TABS: 500.0000 mg | ORAL_TABLET | Freq: Four times a day (QID) | ORAL | Status: DC

## 2012-11-08 MED ORDER — HYDROCODONE-ACETAMINOPHEN 5-325 MG PO TABS: 1.0000 | ORAL_TABLET | Freq: Four times a day (QID) | ORAL | Status: DC | PRN

## 2012-11-08 NOTE — Progress Notes (Signed)
Prev with blood in urine.  CT scan w/o stone or abnormality prev.  No known stone passed.  No blood seen in urine by patient.  No dysuria.  Still with u/a positive today.  Quit smoking last year.  No abd pain.  No FCNAVD.    Back pain started 12/13.  Some better now but still with pulling sensation near the R SI joint.  Some numbness and radicular pain.  He can bend forward now- this is improved. Still with some pain but no weakness and overall improved.  Running out of pain meds.   Meds, vitals, and allergies reviewed.   ROS: See HPI.  Otherwise, noncontributory.  nad ncat Mmm rrr ctab abd soft, not ttp Back w/o midline pain R SLR pos S/S/DTR wnl in BLE x2 Muscle spasm and tenderness near the R SI joint noted

## 2012-11-08 NOTE — Assessment & Plan Note (Signed)
Improving and continue stretching and current meds- vicodin and robaxin.  If not resolved, then image with MRI.  He is improved, so will defer imaging for now.  He agrees.

## 2012-11-08 NOTE — Patient Instructions (Signed)
We'll contact you about the additional lab report.  We may need to get you to see urology.  I would use the pain medicine and muscle relaxer for now.  If you have more pain, we may need to schedule the MRI.  You don't need it now.  Call back if needed.   Take care.

## 2012-11-08 NOTE — Assessment & Plan Note (Signed)
No sx, no stone passed.  CT neg.  Former smoker.  D/w pt about options.  If micro with blood, then refer to uro.  He agrees.  Await micro.

## 2012-11-09 ENCOUNTER — Other Ambulatory Visit: Payer: Self-pay | Admitting: Family Medicine

## 2012-11-09 DIAGNOSIS — R8290 Unspecified abnormal findings in urine: Secondary | ICD-10-CM

## 2012-11-09 LAB — URINALYSIS, MICROSCOPIC ONLY
Casts: NONE SEEN
Crystals: NONE SEEN

## 2012-11-20 ENCOUNTER — Encounter: Payer: Self-pay | Admitting: Family Medicine

## 2012-11-20 ENCOUNTER — Ambulatory Visit (INDEPENDENT_AMBULATORY_CARE_PROVIDER_SITE_OTHER): Payer: Self-pay | Admitting: Family Medicine

## 2012-11-20 VITALS — BP 106/60 | HR 73 | Temp 98.5°F | Ht 71.0 in | Wt 223.0 lb

## 2012-11-20 DIAGNOSIS — Z125 Encounter for screening for malignant neoplasm of prostate: Secondary | ICD-10-CM | POA: Insufficient documentation

## 2012-11-20 DIAGNOSIS — R82998 Other abnormal findings in urine: Secondary | ICD-10-CM

## 2012-11-20 DIAGNOSIS — Z029 Encounter for administrative examinations, unspecified: Secondary | ICD-10-CM

## 2012-11-20 DIAGNOSIS — Z0110 Encounter for examination of ears and hearing without abnormal findings: Secondary | ICD-10-CM

## 2012-11-20 DIAGNOSIS — Z0000 Encounter for general adult medical examination without abnormal findings: Secondary | ICD-10-CM | POA: Insufficient documentation

## 2012-11-20 DIAGNOSIS — R319 Hematuria, unspecified: Secondary | ICD-10-CM

## 2012-11-20 DIAGNOSIS — R8290 Unspecified abnormal findings in urine: Secondary | ICD-10-CM

## 2012-11-20 LAB — POCT URINALYSIS DIPSTICK
Bilirubin, UA: NEGATIVE
Glucose, UA: NEGATIVE
Ketones, UA: NEGATIVE
Leukocytes, UA: NEGATIVE
Nitrite, UA: NEGATIVE
pH, UA: 6

## 2012-11-20 NOTE — Assessment & Plan Note (Signed)
DOT physical. See scanned forms. No contraindication to driving. H/o CAD but no CP, SOB, BLE edema. No syncope and no NTG use. Feels well.  Prev back pain resolved.

## 2012-11-20 NOTE — Progress Notes (Signed)
DOT physical.  See scanned forms.  No contraindication to driving.  H/o CAD but no CP, SOB, BLE edema.  No syncope and no NTG use.  Feels well.   PMH and SH reviewed  Meds, vitals, and allergies reviewed.   ROS: See HPI.  Otherwise negative.    GEN: nad, alert and oriented HEENT: mucous membranes moist NECK: supple w/o LA CV: rrr. PULM: ctab, no inc wob ABD: soft, +bs EXT: no edema SKIN: no acute rash Testes bilaterally descended without nodularity, tenderness or masses. No scrotal masses or lesions. No penis lesions or urethral discharge. CN 2-12 wnl B, S/S/DTR wnl x4

## 2012-11-20 NOTE — Patient Instructions (Addendum)
Take care.  We'll contact you with your lab report.  Glad to see you.

## 2012-11-20 NOTE — Addendum Note (Signed)
Addended by: Alvina Chou on: 11/20/2012 12:21 PM   Modules accepted: Orders

## 2012-11-20 NOTE — Assessment & Plan Note (Signed)
Unclear if this is a false pos on the dip.  Check micro and then notify pt.  He agrees.  He has no symptoms- no dysuria, no abd pain, etc.

## 2012-11-21 ENCOUNTER — Other Ambulatory Visit: Payer: Self-pay | Admitting: Family Medicine

## 2012-11-21 DIAGNOSIS — R319 Hematuria, unspecified: Secondary | ICD-10-CM

## 2012-11-21 LAB — URINALYSIS, MICROSCOPIC ONLY
Bacteria, UA: NONE SEEN
Casts: NONE SEEN

## 2012-11-22 ENCOUNTER — Telehealth: Payer: Self-pay

## 2012-11-22 NOTE — Telephone Encounter (Signed)
Pt request name,address and phone # of urologist pt is to see; Dr Durene Fruits [address] ,[place] [phone]. Pt has appt scheduled 12/19/12 at 11 am. Pt voiced understanding.

## 2013-01-14 ENCOUNTER — Encounter: Payer: Self-pay | Admitting: Family Medicine

## 2013-05-15 ENCOUNTER — Other Ambulatory Visit: Payer: Self-pay | Admitting: *Deleted

## 2013-05-15 MED ORDER — ATORVASTATIN CALCIUM 80 MG PO TABS: 40.0000 mg | ORAL_TABLET | Freq: Every day | ORAL | Status: DC

## 2013-06-15 ENCOUNTER — Telehealth: Payer: Self-pay

## 2013-06-15 ENCOUNTER — Other Ambulatory Visit: Payer: Self-pay | Admitting: Cardiology

## 2013-06-15 ENCOUNTER — Other Ambulatory Visit: Payer: Self-pay | Admitting: Cardiovascular Disease

## 2013-06-15 MED ORDER — NITROGLYCERIN 0.4 MG SL SUBL: 0.4000 mg | SUBLINGUAL_TABLET | SUBLINGUAL | Status: DC | PRN

## 2013-06-15 NOTE — Telephone Encounter (Signed)
Pt called to check status of Plavix refill; advised pt pharmacy had sent refill request to Dr Excell Seltzer and pt said Dr Excell Seltzer is the doctor who prescribed Plavix. Pt will contact Dr Randolm Idol office.

## 2013-07-25 ENCOUNTER — Encounter: Payer: Self-pay | Admitting: Cardiovascular Disease

## 2013-07-25 ENCOUNTER — Ambulatory Visit (INDEPENDENT_AMBULATORY_CARE_PROVIDER_SITE_OTHER): Payer: Self-pay | Admitting: Cardiovascular Disease

## 2013-07-25 VITALS — BP 124/72 | HR 70 | Wt 238.0 lb

## 2013-07-25 DIAGNOSIS — I2510 Atherosclerotic heart disease of native coronary artery without angina pectoris: Secondary | ICD-10-CM

## 2013-07-25 DIAGNOSIS — E7800 Pure hypercholesterolemia, unspecified: Secondary | ICD-10-CM

## 2013-07-25 LAB — HEPATIC FUNCTION PANEL
ALT: 33 U/L (ref 0–53)
Albumin: 4.2 g/dL (ref 3.5–5.2)
Bilirubin, Direct: 0.1 mg/dL (ref 0.0–0.3)
Total Protein: 6.8 g/dL (ref 6.0–8.3)

## 2013-07-25 LAB — LIPID PANEL
Cholesterol: 126 mg/dL (ref 0–200)
HDL: 36.6 mg/dL — ABNORMAL LOW (ref >39.00)
LDL Cholesterol: 53 mg/dL (ref 0–99)
Total CHOL/HDL Ratio: 3
Triglycerides: 180 mg/dL — ABNORMAL HIGH (ref 0.0–149.0)
VLDL: 36 mg/dL (ref 0.0–40.0)

## 2013-07-25 NOTE — Patient Instructions (Addendum)
Your physician recommends that you have lab work today: LIPID, LIVER and CK Total  Your physician recommends that you continue on your current medications as directed. Please refer to the Current Medication list given to you today.  YOU CAN STOP PLAVIX WHEN YOU FINISH YOUR CURRENT SUPPLY  Your physician wants you to follow-up in: 1 YEAR with Dr Excell Seltzer.  You will receive a reminder letter in the mail two months in advance. If you don't receive a letter, please call our office to schedule the follow-up appointment.

## 2013-07-25 NOTE — Progress Notes (Signed)
   HPI:  47 year old gentleman presenting for followup evaluation. The patient has coronary artery disease. He presented in May 2013 with a non-ST elevation infarction. He was found to have severe stenosis of the LAD/diagonal bifurcation and was treated with a drug-eluting stent in his LAD and angioplasty of the diagonal. There was minor nonobstructive disease in the left circumflex and right coronary artery and the patient's left ventricular function was normal with an ejection fraction of 55%. Lipids were last checked in December 2013 with a cholesterol of 80, HDL 30, LDL 27. LFTs were normal. His atorvastatin was reduced to 40 mg at that time.  The patient is complaining of diffuse myalgias. He has no cardiac related symptoms. He specifically denies chest pain, chest pressure, dyspnea, edema, or palpitations. 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 He has no symptoms with exertion.  Outpatient Encounter Prescriptions as of 07/25/2013  Medication Sig Dispense Refill  . aspirin 81 MG tablet Take 81 mg by mouth daily.      Marland Kitchen atorvastatin (LIPITOR) 80 MG tablet Take 0.5 tablets (40 mg total) by mouth daily at 6 PM.  30 tablet  1  . clopidogrel (PLAVIX) 75 MG tablet TAKE ONE TABLET BY MOUTH EVERY DAY  30 tablet  1  . nitroGLYCERIN (NITROSTAT) 0.4 MG SL tablet Place 1 tablet (0.4 mg total) under the tongue every 5 (five) minutes as needed for chest pain.  25 tablet  1   No facility-administered encounter medications on file as of 07/25/2013.    Allergies  Allergen Reactions  . Amoxicillin     Rash     Past Medical History  Diagnosis Date  . Low back pain   . Tobacco abuse     a. 1 1/2 ppd x 20 yrs (02/2012)  . Obesity   . Hypertension   . CAD (coronary artery disease) 03-17-2012    S/P NSTEMI with PCI balloon angioplasty diagonal and DES x 1 mid LAD; normal LV function    ROS: Negative except as per HPI  BP 124/72  Pulse 70  Wt 107.956 kg (238 lb)  BMI 33.21  kg/m2  PHYSICAL EXAM: Pt is alert and oriented, NAD HEENT: normal Neck: JVP - normal, carotids 2+= without bruits Lungs: CTA bilaterally CV: RRR without murmur or gallop Abd: soft, NT, Positive BS, no hepatomegaly Ext: no C/C/E, distal pulses intact and equal Skin: warm/dry no rash  EKG: Normal sinus rhythm 59 beats per minute, right bundle branch block.  ASSESSMENT AND PLAN: 1. Coronary atherosclerosis, native vessel. The patient is stable without anginal symptoms. He is greater than 12 months out from his acute coronary syndrome and stenting of the LAD. He should remain on aspirin lifelong. It is appropriate to stop Plavix at this point per guideline recommendations. I would like to see him back in one year.  2. Hyperlipidemia. He is likely having statin related myalgias. Will check lipids and LFTs today as well as a total CK. Likely will change him to a different drug.  For followup I will see him back in one year.  Tonny Bollman 07/25/2013 9:40 AM

## 2013-07-26 ENCOUNTER — Telehealth: Payer: Self-pay | Admitting: Cardiovascular Disease

## 2013-07-26 DIAGNOSIS — E7800 Pure hypercholesterolemia, unspecified: Secondary | ICD-10-CM

## 2013-07-26 NOTE — Telephone Encounter (Signed)
Pt aware of results by phone.  The pt will stop Lipitor and start Crestor 10mg  daily.  The pt will pick up samples of Crestor to see if he can tolerate this medication.  Repeat labs will be drawn in January 2015.

## 2013-07-26 NOTE — Telephone Encounter (Signed)
Pt calling re blood work results from yesterday, pls call

## 2013-09-03 ENCOUNTER — Other Ambulatory Visit: Payer: Self-pay

## 2013-09-03 ENCOUNTER — Telehealth: Payer: Self-pay | Admitting: Cardiovascular Disease

## 2013-09-03 ENCOUNTER — Telehealth: Payer: Self-pay

## 2013-09-03 MED ORDER — ROSUVASTATIN CALCIUM 10 MG PO TABS: 10.0000 mg | ORAL_TABLET | Freq: Every day | ORAL | Status: DC

## 2013-09-03 NOTE — Telephone Encounter (Signed)
The pt would like to know if he can take anything else in the place of Crestor due to cost.  The pt has taken Atorvastatin in the past but did not tolerate this medication. I will forward this message to Dr Excell Seltzer.

## 2013-09-03 NOTE — Telephone Encounter (Signed)
New message    Want refill of crestor----[facility] in [place] ([address])

## 2013-09-03 NOTE — Telephone Encounter (Signed)
Pt request Crestor samples or rx sent to [facility] [address] pharmacy; Dr Excell Seltzer prescribed for pt and pt will ck with Dr Earmon Phoenix office.

## 2013-09-04 ENCOUNTER — Other Ambulatory Visit: Payer: Self-pay

## 2013-09-04 MED ORDER — ROSUVASTATIN CALCIUM 10 MG PO TABS: 10.0000 mg | ORAL_TABLET | Freq: Every day | ORAL | Status: DC

## 2013-09-04 NOTE — Telephone Encounter (Signed)
Try simvastatin 20 mg daily

## 2013-09-04 NOTE — Telephone Encounter (Signed)
Attempted to reach the pt but no answer. 

## 2013-09-13 ENCOUNTER — Other Ambulatory Visit: Payer: Self-pay

## 2013-09-13 MED ORDER — SIMVASTATIN 20 MG PO TABS: 20.0000 mg | ORAL_TABLET | Freq: Every day | ORAL | Status: DC

## 2013-09-13 NOTE — Telephone Encounter (Signed)
I spoke with the pt and we will discontinue Crestor at this time due to cost and the pt is also having muscle aches.  The pt would like to try Simvastatin.  I have sent in a new Rx for this medication.

## 2013-10-29 ENCOUNTER — Other Ambulatory Visit (INDEPENDENT_AMBULATORY_CARE_PROVIDER_SITE_OTHER): Payer: Self-pay

## 2013-10-29 DIAGNOSIS — E7800 Pure hypercholesterolemia, unspecified: Secondary | ICD-10-CM

## 2013-10-29 LAB — LIPID PANEL
Cholesterol: 160 mg/dL (ref 0–200)
HDL: 39.6 mg/dL (ref >39.00)
Total CHOL/HDL Ratio: 4
Triglycerides: 214 mg/dL — ABNORMAL HIGH (ref 0.0–149.0)
VLDL: 42.8 mg/dL — AB (ref 0.0–40.0)

## 2013-10-29 LAB — HEPATIC FUNCTION PANEL
ALT: 27 U/L (ref 0–53)
AST: 20 U/L (ref 0–37)
Albumin: 4.3 g/dL (ref 3.5–5.2)
Alkaline Phosphatase: 59 U/L (ref 39–117)
BILIRUBIN DIRECT: 0.1 mg/dL (ref 0.0–0.3)
BILIRUBIN TOTAL: 0.6 mg/dL (ref 0.3–1.2)
TOTAL PROTEIN: 7 g/dL (ref 6.0–8.3)

## 2013-10-29 LAB — LDL CHOLESTEROL, DIRECT: Direct LDL: 81.5 mg/dL

## 2013-11-05 IMAGING — CT CT ABD-PELV W/O CM
2 of 4 series · 17 of 46 positions shown, 19 images · non-contrast
Comparison: None.

CLINICAL DATA: Right-sided flank pain and hematuria.

CT ABDOMEN AND PELVIS WITHOUT CONTRAST
TECHNIQUE: Multidetector CT imaging of the abdomen and pelvis was
performed following the standard protocol without intravenous
contrast.

[Series 2: ap stone study · axial · 0.81mm/px · z∈[-506,-76]mm · 14 of 94 slices shown, 16 images]
[im 4/94  soft-tissue]
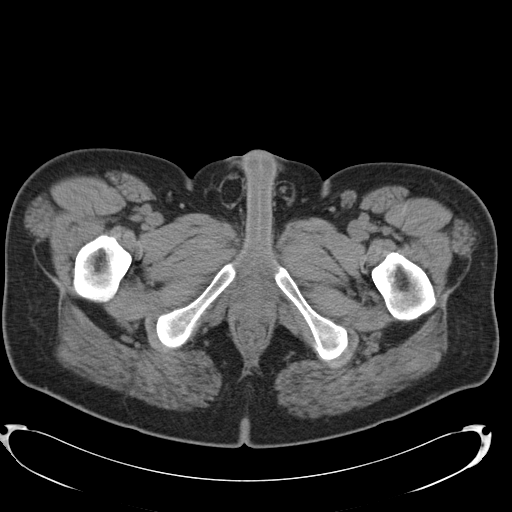
[im 4/94  bone]
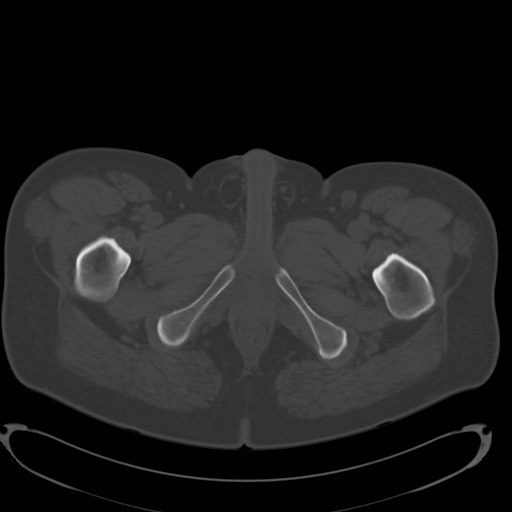
[im 12/94  soft-tissue]
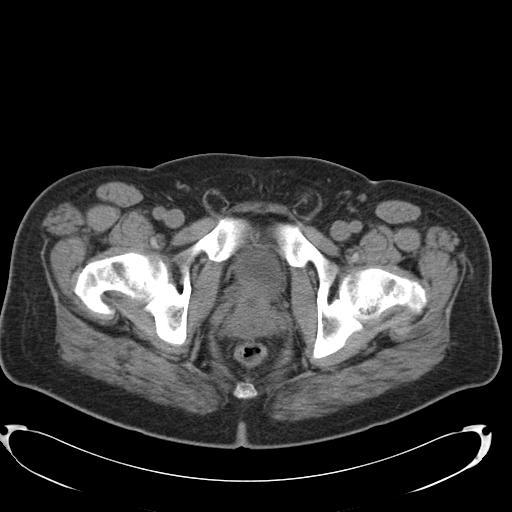
[im 19/94  soft-tissue]
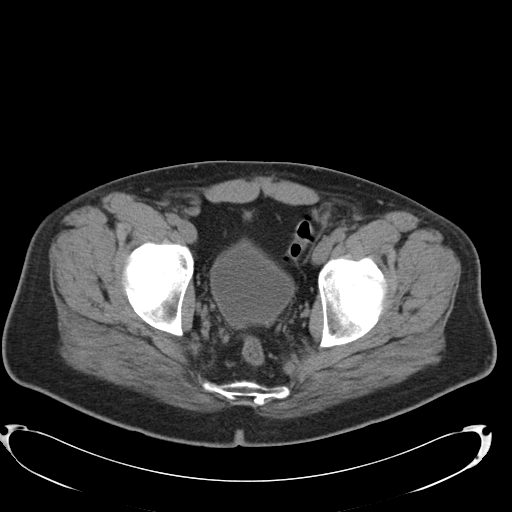
[im 27/94  soft-tissue]
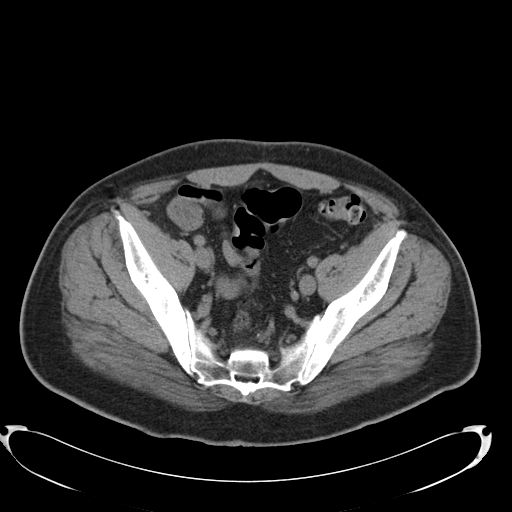
[im 30/94  soft-tissue]
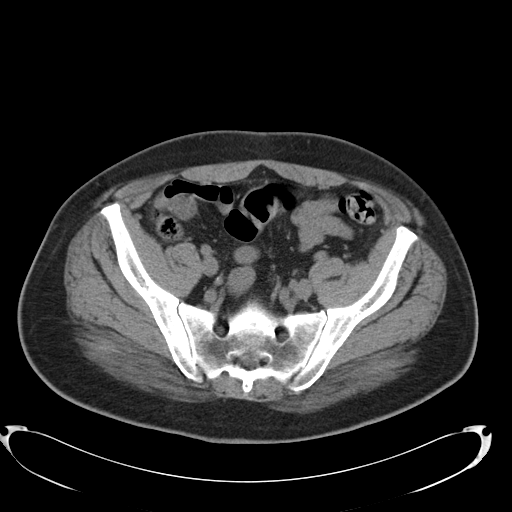
[im 38/94  soft-tissue]
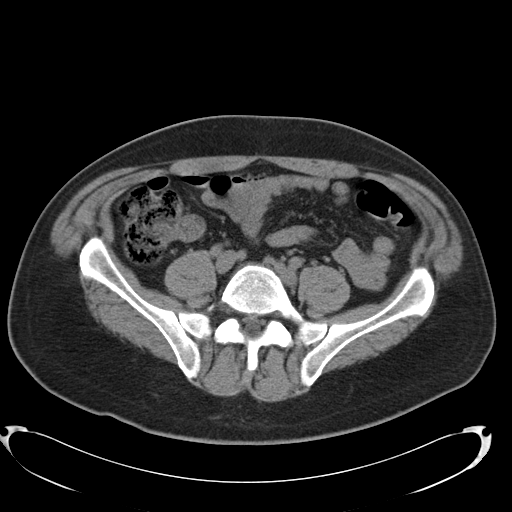
[im 45/94  soft-tissue]
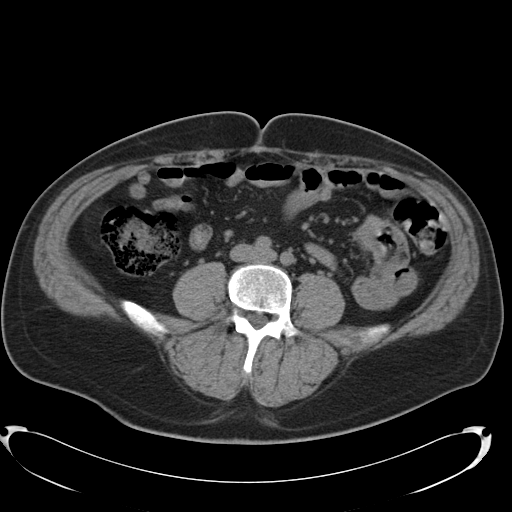
[im 49/94  soft-tissue]
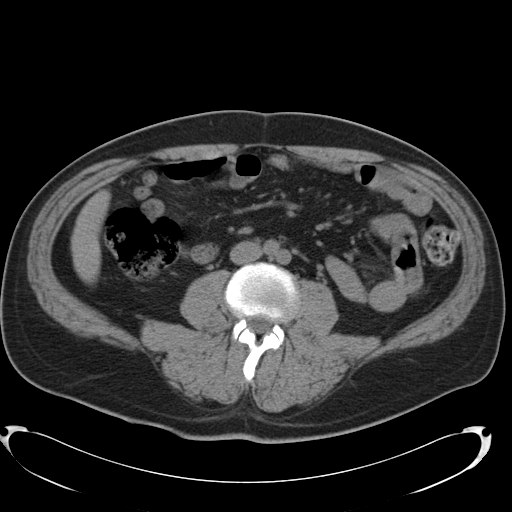
[im 56/94  soft-tissue]
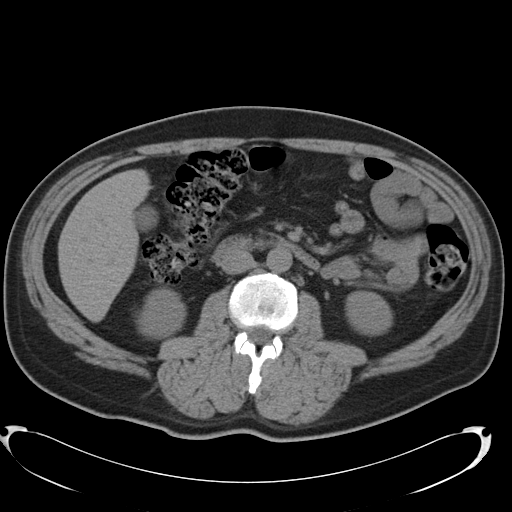
[im 56/94  bone]
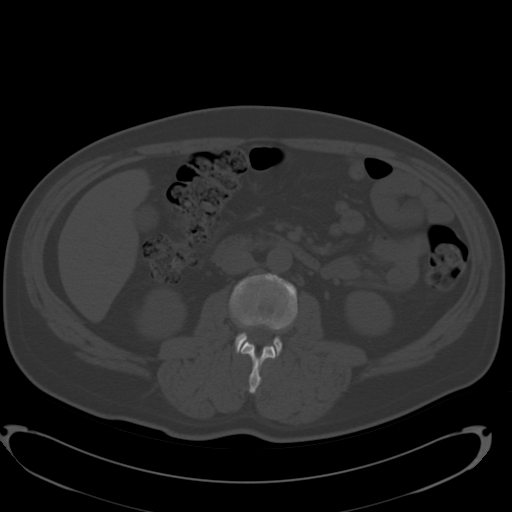
[im 64/94  soft-tissue]
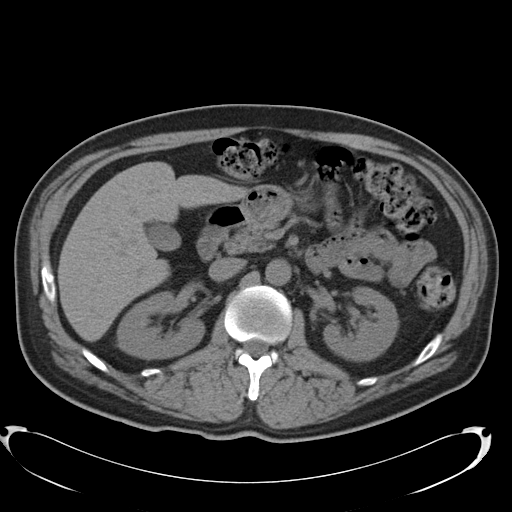
[im 71/94  soft-tissue]
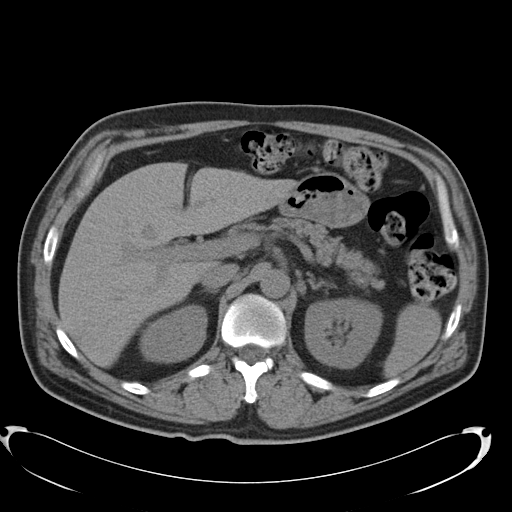
[im 75/94  soft-tissue]
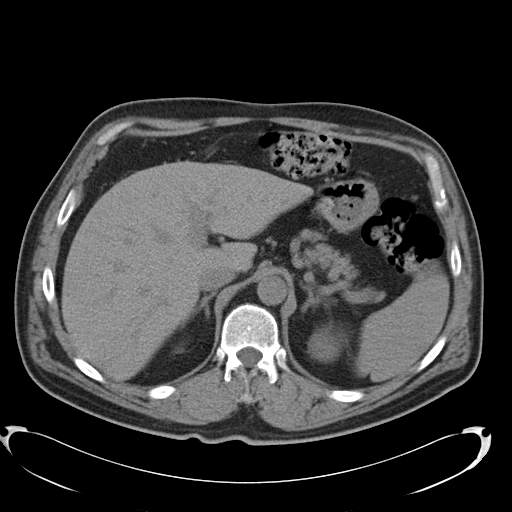
[im 82/94  soft-tissue]
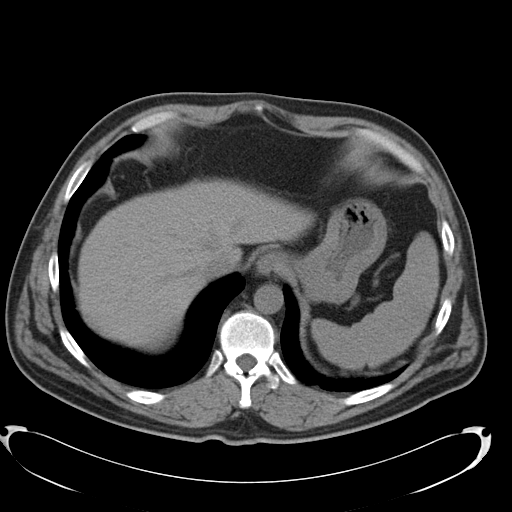
[im 90/94  soft-tissue]
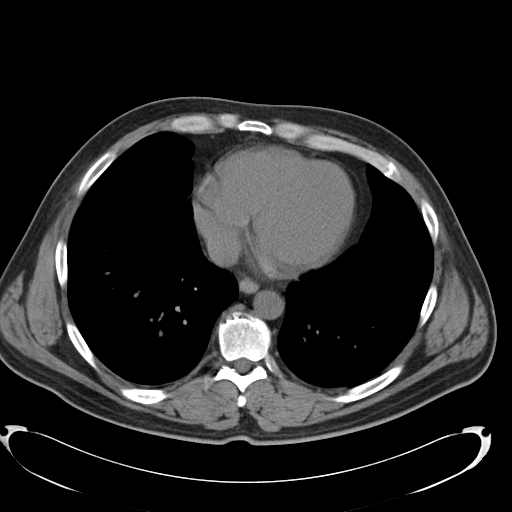

[Series 602: cor · coronal · 0.95mm/px · 3 of 118 slices shown]
[im 40/118  soft-tissue]
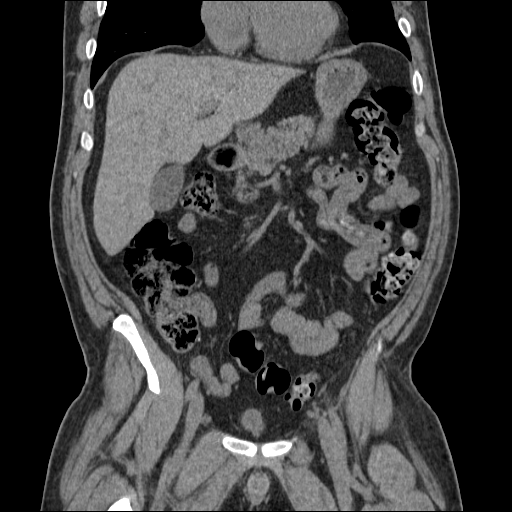
[im 53/118  soft-tissue]
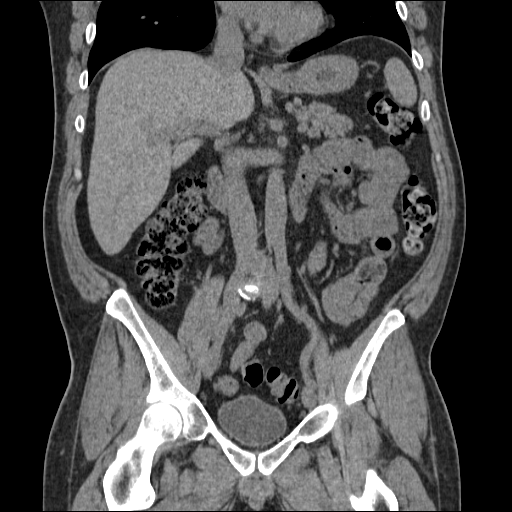
[im 66/118  soft-tissue]
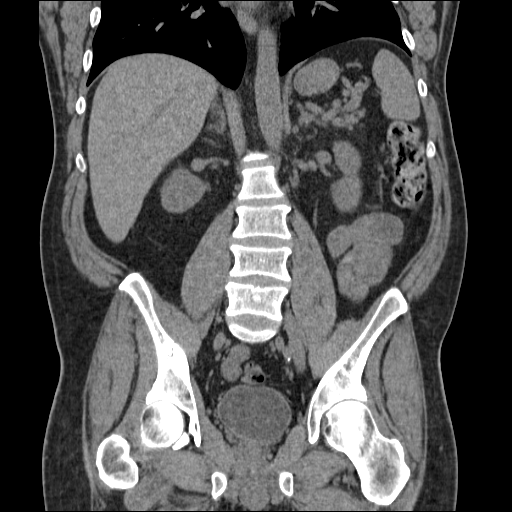

[17 of 46 positions shown; findings below may reference images not displayed]

FINDINGS: No evidence of renal calculi or hydronephrosis.  No
evidence of ureteral calculi or dilatation.  No bladder calculi
identified.  No evidence of perinephric fluid or inflammatory
changes.

The other abdominal parenchymal organs are also normal in
appearance on this noncontrast study.  Gallbladder is unremarkable.
No soft tissue masses or lymphadenopathy are identified.

No evidence of inflammatory process or abnormal fluid collections.
No evidence of bowel wall thickening or dilatation.
IMPRESSION: Negative.  No evidence of urolithiasis, hydronephrosis, or other
acute findings.

## 2014-03-09 ENCOUNTER — Other Ambulatory Visit: Payer: Self-pay | Admitting: Cardiovascular Disease

## 2014-03-14 ENCOUNTER — Other Ambulatory Visit: Payer: Self-pay | Admitting: Cardiovascular Disease

## 2014-03-14 ENCOUNTER — Other Ambulatory Visit: Payer: Self-pay | Admitting: *Deleted

## 2014-03-14 MED ORDER — NITROGLYCERIN 0.4 MG SL SUBL: 0.4000 mg | SUBLINGUAL_TABLET | SUBLINGUAL | Status: DC | PRN

## 2014-04-30 ENCOUNTER — Other Ambulatory Visit: Payer: Self-pay | Admitting: Cardiovascular Disease

## 2014-05-03 ENCOUNTER — Telehealth: Payer: Self-pay

## 2014-05-03 NOTE — Telephone Encounter (Signed)
[name] request refill simvastatin; no DPR signed; advised [name] to contact [facility] [address]. Appears Dr Excell Seltzerooper has already refilled.(did not give this info to [name]; just advised her to contact pharmacy.

## 2014-07-21 ENCOUNTER — Other Ambulatory Visit: Payer: Self-pay | Admitting: Cardiovascular Disease

## 2014-08-19 ENCOUNTER — Ambulatory Visit (INDEPENDENT_AMBULATORY_CARE_PROVIDER_SITE_OTHER): Payer: Self-pay | Admitting: Cardiovascular Disease

## 2014-08-19 ENCOUNTER — Encounter: Payer: Self-pay | Admitting: Cardiovascular Disease

## 2014-08-19 VITALS — BP 115/72 | HR 64 | Ht 71.0 in | Wt 233.8 lb

## 2014-08-19 DIAGNOSIS — E785 Hyperlipidemia, unspecified: Secondary | ICD-10-CM

## 2014-08-19 DIAGNOSIS — I2510 Atherosclerotic heart disease of native coronary artery without angina pectoris: Secondary | ICD-10-CM

## 2014-08-19 MED ORDER — SIMVASTATIN 20 MG PO TABS: ORAL_TABLET | ORAL | Status: DC

## 2014-08-19 NOTE — Patient Instructions (Signed)
Your physician recommends that you return for a FASTING LIPID, LIVER and CK in January 2016--nothing to eat or drink after midnight, lab opens at 7:30 AM  Your physician wants you to follow-up in: 1 YEAR with Dr Excell Seltzerooper.  You will receive a reminder letter in the mail two months in advance. If you don't receive a letter, please call our office to schedule the follow-up appointment.  Your physician recommends that you continue on your current medications as directed. Please refer to the Current Medication list given to you today.

## 2014-08-19 NOTE — Progress Notes (Signed)
   Background: The patient is followed for coronary artery disease. He presented in May 2013 with a non-ST elevation infarction. He was found to have severe stenosis of the LAD/diagonal bifurcation and was treated with a drug-eluting stent in his LAD and angioplasty of the diagonal. There was minor nonobstructive disease in the left circumflex and right coronary artery and the patient's left ventricular function was normal with an ejection fraction of 55%.  HPI:  48 year old gentleman presenting for follow-up evaluation. He has done well from a cardiac perspective. He complains of very mild myalgias that he relates to simvastatin. Symptoms are better than they have been in the past. He denies exertional chest pain or pressure. He denies any symptoms like those he experienced with his acute coronary syndrome in 2013. No shortness of breath, edema, or heart palpitations.   Outpatient Encounter Prescriptions as of 08/19/2014  Medication Sig  . aspirin 81 MG tablet Take 81 mg by mouth daily.  . nitroGLYCERIN (NITROSTAT) 0.4 MG SL tablet Place 1 tablet (0.4 mg total) under the tongue every 5 (five) minutes as needed for chest pain.  . simvastatin (ZOCOR) 20 MG tablet TAKE ONE TABLET BY MOUTH AT BEDTIME  . [DISCONTINUED] clopidogrel (PLAVIX) 75 MG tablet TAKE ONE TABLET BY MOUTH EVERY DAY    Allergies  Allergen Reactions  . Amoxicillin     Rash     Past Medical History  Diagnosis Date  . Low back pain   . Tobacco abuse     a. 1 1/2 ppd x 20 yrs (02/2012)  . Obesity   . Hypertension   . CAD (coronary artery disease) 03-17-2012    S/P NSTEMI with PCI balloon angioplasty diagonal and DES x 1 mid LAD; normal LV function    family history includes Heart attack in his father; Hypertension in his mother; Other in an other family member.   ROS: Negative except as per HPI  BP 115/72  Pulse 64  Ht 5\' 11"  (1.803 m)  Wt 233 lb 12.8 oz (106.051 kg)  BMI 32.62 kg/m2  PHYSICAL EXAM: Pt is alert  and oriented, NAD HEENT: normal Neck: JVP - normal, carotids 2+= without bruits Lungs: CTA bilaterally CV: RRR without murmur or gallop Abd: soft, NT, Positive BS, no hepatomegaly Ext: no C/C/E, distal pulses intact and equal Skin: warm/dry no rash  EKG:  Normal sinus rhythm 64 bpm, right bundle branch block.  ASSESSMENT AND PLAN: 1. Coronary artery disease, native vessel, without symptoms of angina. The patient will continue on aspirin and a statin drug. He was intolerant to beta blockers. The patient is normotensive and has no angina. I will see him back in one year.  2. Hyperlipidemia. Last lipids reviewed. Will repeat in January at a 1 year interval from his previous. Lifestyle modification reviewed. Continue simvastatin which he is tolerating reasonably well.  Tonny BollmanMichael Salah Burlison, MD 08/19/2014 4:07 PM

## 2014-08-22 ENCOUNTER — Ambulatory Visit: Payer: Self-pay | Admitting: Cardiovascular Disease

## 2014-10-03 ENCOUNTER — Encounter (HOSPITAL_COMMUNITY): Payer: Self-pay | Admitting: Cardiovascular Disease

## 2014-10-25 HISTORY — PX: HERNIA REPAIR: SHX51

## 2014-11-08 ENCOUNTER — Other Ambulatory Visit: Payer: Self-pay

## 2014-11-21 ENCOUNTER — Other Ambulatory Visit (INDEPENDENT_AMBULATORY_CARE_PROVIDER_SITE_OTHER): Payer: Self-pay | Admitting: *Deleted

## 2014-11-21 DIAGNOSIS — I2510 Atherosclerotic heart disease of native coronary artery without angina pectoris: Secondary | ICD-10-CM

## 2014-11-21 DIAGNOSIS — E785 Hyperlipidemia, unspecified: Secondary | ICD-10-CM

## 2014-11-21 LAB — HEPATIC FUNCTION PANEL
ALT: 20 U/L (ref 0–53)
AST: 17 U/L (ref 0–37)
Albumin: 4.1 g/dL (ref 3.5–5.2)
Alkaline Phosphatase: 73 U/L (ref 39–117)
Bilirubin, Direct: 0.1 mg/dL (ref 0.0–0.3)
Total Bilirubin: 0.7 mg/dL (ref 0.2–1.2)
Total Protein: 6.6 g/dL (ref 6.0–8.3)

## 2014-11-21 LAB — LIPID PANEL
CHOLESTEROL: 129 mg/dL (ref 0–200)
HDL: 32.7 mg/dL — ABNORMAL LOW (ref >39.00)
LDL Cholesterol: 60 mg/dL (ref 0–99)
NONHDL: 96.3
Total CHOL/HDL Ratio: 4
Triglycerides: 184 mg/dL — ABNORMAL HIGH (ref 0.0–149.0)
VLDL: 36.8 mg/dL (ref 0.0–40.0)

## 2014-11-21 LAB — CK: Total CK: 67 U/L (ref 7–232)

## 2014-11-25 ENCOUNTER — Telehealth: Payer: Self-pay | Admitting: *Deleted

## 2014-11-25 NOTE — Telephone Encounter (Signed)
ptcb and has been notified of lab results. Pt advise to continue to watch diet and continue to exercise. Pt advise to watch simple carbs and try to change over to more complex carbs. Pt said thank you.

## 2015-05-14 ENCOUNTER — Emergency Department (HOSPITAL_COMMUNITY)
Admission: EM | Admit: 2015-05-14 | Discharge: 2015-05-14 | Disposition: A | Discharge status: Home / Self Care | Payer: Self-pay | Attending: Emergency Medicine | Admitting: Emergency Medicine

## 2015-05-14 ENCOUNTER — Encounter (HOSPITAL_COMMUNITY): Payer: Self-pay | Admitting: Family Medicine

## 2015-05-14 DIAGNOSIS — K4090 Unilateral inguinal hernia, without obstruction or gangrene, not specified as recurrent: Secondary | ICD-10-CM | POA: Insufficient documentation

## 2015-05-14 DIAGNOSIS — I10 Essential (primary) hypertension: Secondary | ICD-10-CM | POA: Insufficient documentation

## 2015-05-14 DIAGNOSIS — M545 Low back pain: Secondary | ICD-10-CM | POA: Insufficient documentation

## 2015-05-14 DIAGNOSIS — G8929 Other chronic pain: Secondary | ICD-10-CM | POA: Insufficient documentation

## 2015-05-14 DIAGNOSIS — Z7982 Long term (current) use of aspirin: Secondary | ICD-10-CM | POA: Insufficient documentation

## 2015-05-14 DIAGNOSIS — R1110 Vomiting, unspecified: Secondary | ICD-10-CM | POA: Insufficient documentation

## 2015-05-14 DIAGNOSIS — R197 Diarrhea, unspecified: Secondary | ICD-10-CM | POA: Insufficient documentation

## 2015-05-14 DIAGNOSIS — Z87891 Personal history of nicotine dependence: Secondary | ICD-10-CM | POA: Insufficient documentation

## 2015-05-14 DIAGNOSIS — E669 Obesity, unspecified: Secondary | ICD-10-CM | POA: Insufficient documentation

## 2015-05-14 DIAGNOSIS — Z880 Allergy status to penicillin: Secondary | ICD-10-CM | POA: Insufficient documentation

## 2015-05-14 DIAGNOSIS — I2510 Atherosclerotic heart disease of native coronary artery without angina pectoris: Secondary | ICD-10-CM | POA: Insufficient documentation

## 2015-05-14 NOTE — Discharge Instructions (Signed)

## 2015-05-14 NOTE — ED Provider Notes (Signed)
CSN: [id number]     Arrival date & time 05/14/15  1033 History   First MD Initiated Contact with Patient 05/14/15 1047     Chief Complaint  Patient presents with  . Groin Pain    Patient is a 49 y.o. male presenting with groin pain. The history is provided by the patient.  Groin Pain This is a new problem. The current episode started more than 2 days ago. The problem occurs daily. The problem has been gradually worsening. Pertinent negatives include no chest pain, no abdominal pain and no shortness of breath. The symptoms are aggravated by bending. The symptoms are relieved by rest.  pt reports right groin pain/swelling for past 3 days Worse with bending and heavy lifting No fever No dysuria He reports recent vomiting/diarrhea over past 24 hrs (nonbloody) He reports chronic back pain No other complaints  Past Medical History  Diagnosis Date  . Low back pain   . Tobacco abuse     a. 1 1/2 ppd x 20 yrs (02/2012)  . Obesity   . Hypertension   . CAD (coronary artery disease) 03-17-2012    S/P NSTEMI with PCI balloon angioplasty diagonal and DES x 1 mid LAD; normal LV function   Past Surgical History  Procedure Laterality Date  . Appendectomy    . Coronary stent placement  02/2012    balloon PCI to DX; DES to mid LAD  . Left heart catheterization with coronary angiogram N/A 03/17/2012    Procedure: LEFT HEART CATHETERIZATION WITH CORONARY ANGIOGRAM;  Surgeon: Kathleene Hazelhristopher D McAlhany, MD;  [facility];  Service: Cardiovascular;  Laterality: N/A;  . Intravascular ultrasound  03/17/2012    Procedure: INTRAVASCULAR ULTRASOUND;  Surgeon: Kathleene Hazelhristopher D McAlhany, MD;  [facility];  Service: Cardiovascular;;   Family History  Problem Relation Age of Onset  . Heart attack Father     3 mi's - first in 1340's.  died @ 2762.  Marland Kitchen. Hypertension Mother     alive  . Other      2 brothers alive & well   History  Substance Use Topics  . Smoking status: Former Smoker -- 1.50  packs/day for 20 years    Types: Cigarettes    Quit date: 03/19/2012  . Smokeless tobacco: Never Used  . Alcohol Use: No     Comment: former    Review of Systems  Constitutional: Negative for fever.  Respiratory: Negative for shortness of breath.   Cardiovascular: Negative for chest pain.  Gastrointestinal: Positive for vomiting and diarrhea. Negative for abdominal pain.  Genitourinary: Negative for dysuria, discharge and difficulty urinating.  Musculoskeletal:       Chronic back pain   All other systems reviewed and are negative.     Allergies  Amoxicillin  Home Medications   Prior to Admission medications   Medication Sig Start Date End Date Taking? Authorizing Provider  aspirin 81 MG tablet Take 81 mg by mouth daily.    Historical Provider, MD  nitroGLYCERIN (NITROSTAT) 0.4 MG SL tablet Place 1 tablet (0.4 mg total) under the tongue every 5 (five) minutes as needed for chest pain. 03/14/14 03/14/15  Tonny BollmanMichael Cooper, MD  simvastatin (ZOCOR) 20 MG tablet TAKE ONE TABLET BY MOUTH AT BEDTIME 08/19/14   Tonny BollmanMichael Cooper, MD   BP 112/70 mmHg  Pulse 71  Temp(Src) 98 F (36.7 C) (Oral)  Resp 16  SpO2 95% Physical Exam CONSTITUTIONAL: Well developed/well nourished HEAD: Normocephalic/atraumatic EYES: EOMI/PERRL ENMT: Mucous membranes moist NECK:  supple no meningeal signs SPINE/BACK:entire spine nontender CV: S1/S2 noted, no murmurs/rubs/gallops noted LUNGS: Lungs are clear to auscultation bilaterally, no apparent distress ABDOMEN: soft, nontender, no rebound or guarding, bowel sounds noted throughout abdomen GU:no cva tenderness. No scrotal tenderness/edema/erythema.  Testicles descended bilaterally, nontender testicles.  He has right inguinal hernia that reduces with rest.  Worsened with cough.  No penile discharge noted.  Nurse chaperone present for exam.   NEURO: Pt is awake/alert/appropriate, moves all extremitiesx4.  No facial droop.   EXTREMITIES: pulses normal/equal,  full ROM SKIN: warm, color normal PSYCH: no abnormalities of mood noted, alert and oriented to situation  ED Course  Procedures pt with probable right inguinal hernia He does heavy lifting at work, likely cause Told to stop smoking Given work restrictions Referred to gen surgery He is well appearing Discussed strict ER return precautions  MDM   Final diagnoses:  Unilateral inguinal hernia without obstruction or gangrene, recurrence not specified    Nursing notes including past medical history and social history reviewed and considered in documentation     Zadie Rhine, MD 05/14/15 1210

## 2015-05-14 NOTE — ED Notes (Signed)
Pt having severe right groin pain x 3 days. Denies trouble urinating. sts some swelling. sts burning.

## 2015-08-18 ENCOUNTER — Encounter: Payer: Self-pay | Admitting: Family Medicine

## 2015-09-29 ENCOUNTER — Ambulatory Visit (INDEPENDENT_AMBULATORY_CARE_PROVIDER_SITE_OTHER): Payer: Self-pay | Admitting: Cardiovascular Disease

## 2015-09-29 ENCOUNTER — Encounter: Payer: Self-pay | Admitting: Cardiovascular Disease

## 2015-09-29 VITALS — BP 122/62 | HR 65 | Ht 71.0 in | Wt 232.0 lb

## 2015-09-29 DIAGNOSIS — I2510 Atherosclerotic heart disease of native coronary artery without angina pectoris: Secondary | ICD-10-CM

## 2015-09-29 NOTE — Patient Instructions (Signed)
Medication Instructions:  Your physician recommends that you continue on your current medications as directed. Please refer to the Current Medication list given to you today.  Labwork: Your physician recommends that you return for a FASTING LIPID and LIVER in January 2017--nothing to eat or drink after midnight, lab opens at 7:30 AM.   Testing/Procedures: No new orders.   Follow-Up: Your physician wants you to follow-up in: 1 YEAR with Dr Excell Seltzerooper.  You will receive a reminder letter in the mail two months in advance. If you don't receive a letter, please call our office to schedule the follow-up appointment.  Any Other Special Instructions Will Be Listed Below (If Applicable).     If you need a refill on your cardiac medications before your next appointment, please call your pharmacy.

## 2015-09-29 NOTE — Progress Notes (Signed)
Cardiology Office Note Date:  09/29/2015   ID:  [name], DOB 08-Apr-1966, MRN [id number]  PCP:  Crawford Givens, MD  Cardiologist:  Tonny Bollman, MD    Chief Complaint  Patient presents with  . Coronary Artery Disease   History of Present Illness: [name] is a 49 y.o. male who presents for follow-up of coronary artery disease. He presented in May 2013 with a non-ST elevation infarction. He was found to have severe stenosis of the LAD/diagonal bifurcation and was treated with a drug-eluting stent in his LAD and angioplasty of the diagonal. There was minor nonobstructive disease in the left circumflex and right coronary artery and the patient's left ventricular function was normal with an ejection fraction of 55%.  He is doing well. He is able to do hard physical work without exertional symptoms. He doesn't feel limited in any way. Today, he denies symptoms of palpitations, chest pain, shortness of breath, orthopnea, PND, lower extremity edema, dizziness, or syncope.  Past Medical History  Diagnosis Date  . Low back pain   . Tobacco abuse     a. 1 1/2 ppd x 20 yrs (02/2012)  . Obesity   . Hypertension   . CAD (coronary artery disease) 03-17-2012    S/P NSTEMI with PCI balloon angioplasty diagonal and DES x 1 mid LAD; normal LV function    Past Surgical History  Procedure Laterality Date  . Appendectomy    . Coronary stent placement  02/2012    balloon PCI to DX; DES to mid LAD  . Left heart catheterization with coronary angiogram N/A 03/17/2012    Procedure: LEFT HEART CATHETERIZATION WITH CORONARY ANGIOGRAM;  Surgeon: Kathleene Hazel, MD;  Location: [facility] CATH LAB;  Service: Cardiovascular;  Laterality: N/A;  . Intravascular ultrasound  03/17/2012    Procedure: INTRAVASCULAR ULTRASOUND;  Surgeon: Kathleene Hazel, MD;  Location: [facility] CATH LAB;  Service: Cardiovascular;;  . Hernia repair Right 2016    Current Outpatient Prescriptions  Medication Sig  Dispense Refill  . aspirin 81 MG tablet Take 81 mg by mouth daily.    . nitroGLYCERIN (NITROSTAT) 0.4 MG SL tablet Place 1 tablet (0.4 mg total) under the tongue every 5 (five) minutes as needed for chest pain. 25 tablet 1  . simvastatin (ZOCOR) 20 MG tablet TAKE ONE TABLET BY MOUTH AT BEDTIME 90 tablet 3   No current facility-administered medications for this visit.    Allergies:   Amoxicillin   Social History:  The patient  reports that he quit smoking about 3 years ago. His smoking use included Cigarettes. He has a 30 pack-year smoking history. He has never used smokeless tobacco. He reports that he does not drink alcohol or use illicit drugs.   Family History:  The patient's  family history includes Heart attack in his father; Hypertension in his mother; Other in an other family member.    ROS:  Please see the history of present illness.  Otherwise, review of systems is positive for back pain.  All other systems are reviewed and negative.    PHYSICAL EXAM: VS:  BP 122/62 mmHg  Pulse 65  Ht  (1.803 m)  Wt 232 lb (105.235 kg)  BMI 32.37 kg/m2 , BMI Body mass index is 32.37 kg/(m^2). GEN: Well nourished, well developed, in no acute distress HEENT: normal Neck: no JVD, no masses. No carotid bruits Cardiac: RRR without murmur or gallop  Respiratory:  clear to auscultation bilaterally, normal work of breathing GI: soft, nontender, nondistended, + BS MS: no deformity or atrophy Ext: no pretibial edema, pedal pulses 2+= bilaterally Skin: warm and dry, no rash Neuro:  Strength and sensation are intact Psych: euthymic mood, full affect  EKG:  EKG is ordered today. The ekg ordered today shows NSR, RBBB, HR 65 bpm, no change from last year's tracing  Recent Labs: 11/21/2014: ALT 20   Lipid Panel     Component Value Date/Time   CHOL 129 11/21/2014 0937   TRIG 184.0* 11/21/2014 0937   HDL 32.70* 11/21/2014 0937   CHOLHDL 4 11/21/2014 0937   VLDL 36.8  11/21/2014 0937   LDLCALC 60 11/21/2014 0937   LDLDIRECT 81.5 10/29/2013 0904      Wt Readings from Last 3 Encounters:  09/29/15 232 lb (105.235 kg)  08/19/14 233 lb 12.8 oz (106.051 kg)  07/25/13 238 lb (107.956 kg)     Cardiac Studies Reviewed: Cath Note 03-17-2012: Impression: 1. NSTEMI 2. 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  3. Subtotally occluded very small early diagonal branch which may be culprit for his ongoing pain. Too small for PCI 4. Preserved LV systolic function  Recommendations: He will be continued on ASA and Effient for one year. Will start beta blocker and statin as pt tolerates. To TCU. Out to floor in am or d/c home in am if doing well.    Complications: None. The patient tolerated the procedure well.     ASSESSMENT AND PLAN: 1.  CAD, native vessel: no recurrent anginal symptoms. Continue ASA and a statin drug. FU one year.  2. Hyperlipidemia: last lipids reviewed and at goal. Repeat labs January 2017 to include fasting lipids and LFT's.   Current medicines are reviewed with the patient today.  The patient does not have concerns regarding medicines.  Labs/ tests ordered today include:   Orders Placed This Encounter  Procedures  . Lipid panel  . Hepatic function panel  . EKG 12-Lead    Disposition:   FU one year  Signed, Tonny Bollmanooper, Zuleyka Kloc, MD  09/29/2015 11:24 AM    [facility] [address], [place], [place]  [address] Phone: [phone]; Fax: [phone]

## 2015-11-04 ENCOUNTER — Other Ambulatory Visit (INDEPENDENT_AMBULATORY_CARE_PROVIDER_SITE_OTHER): Payer: Self-pay | Admitting: *Deleted

## 2015-11-04 DIAGNOSIS — I2510 Atherosclerotic heart disease of native coronary artery without angina pectoris: Secondary | ICD-10-CM

## 2015-11-04 LAB — HEPATIC FUNCTION PANEL
ALK PHOS: 71 U/L (ref 40–115)
ALT: 19 U/L (ref 9–46)
AST: 17 U/L (ref 10–40)
Albumin: 4 g/dL (ref 3.6–5.1)
BILIRUBIN DIRECT: 0.1 mg/dL (ref <=0.2)
BILIRUBIN INDIRECT: 0.6 mg/dL (ref 0.2–1.2)
BILIRUBIN TOTAL: 0.7 mg/dL (ref 0.2–1.2)
Total Protein: 6.4 g/dL (ref 6.1–8.1)

## 2015-11-04 LAB — LIPID PANEL
Cholesterol: 167 mg/dL (ref 125–200)
HDL: 33 mg/dL — ABNORMAL LOW (ref >=40)
LDL Cholesterol: 76 mg/dL (ref <130)
Total CHOL/HDL Ratio: 5.1 Ratio — ABNORMAL HIGH (ref <=5.0)
Triglycerides: 291 mg/dL — ABNORMAL HIGH (ref <150)
VLDL: 58 mg/dL — ABNORMAL HIGH (ref <30)

## 2015-11-20 ENCOUNTER — Telehealth: Payer: Self-pay | Admitting: Cardiovascular Disease

## 2015-11-20 NOTE — Telephone Encounter (Signed)
I spoke with the pt and made him aware that he is okay for vaccination.

## 2015-11-20 NOTE — Telephone Encounter (Signed)
New Message  Pt calling to speak w/ RN to see if he can have TDAp shot for whooping cough- Please call back and discuss.

## 2015-12-19 ENCOUNTER — Other Ambulatory Visit: Payer: Self-pay | Admitting: Cardiovascular Disease

## 2015-12-19 ENCOUNTER — Other Ambulatory Visit: Payer: Self-pay

## 2015-12-19 DIAGNOSIS — I2510 Atherosclerotic heart disease of native coronary artery without angina pectoris: Secondary | ICD-10-CM

## 2015-12-19 DIAGNOSIS — E785 Hyperlipidemia, unspecified: Secondary | ICD-10-CM

## 2015-12-19 MED ORDER — SIMVASTATIN 20 MG PO TABS: ORAL_TABLET | ORAL | Status: DC

## 2017-01-27 ENCOUNTER — Other Ambulatory Visit: Payer: Self-pay | Admitting: Cardiovascular Disease

## 2017-01-27 DIAGNOSIS — E785 Hyperlipidemia, unspecified: Secondary | ICD-10-CM

## 2017-01-27 DIAGNOSIS — I2510 Atherosclerotic heart disease of native coronary artery without angina pectoris: Secondary | ICD-10-CM

## 2017-01-31 ENCOUNTER — Other Ambulatory Visit: Payer: Self-pay | Admitting: Cardiovascular Disease

## 2017-01-31 DIAGNOSIS — E785 Hyperlipidemia, unspecified: Secondary | ICD-10-CM

## 2017-01-31 DIAGNOSIS — I2510 Atherosclerotic heart disease of native coronary artery without angina pectoris: Secondary | ICD-10-CM

## 2017-03-21 ENCOUNTER — Other Ambulatory Visit: Payer: Self-pay | Admitting: Cardiovascular Disease

## 2017-03-21 DIAGNOSIS — I2510 Atherosclerotic heart disease of native coronary artery without angina pectoris: Secondary | ICD-10-CM

## 2017-03-21 DIAGNOSIS — E785 Hyperlipidemia, unspecified: Secondary | ICD-10-CM

## 2017-03-22 NOTE — Telephone Encounter (Signed)
New message     *STAT* If patient is at the pharmacy, call can be transferred to refill team.   1. Which medications need to be refilled? (please list name of each medication and dose if known) simvastatin 20 mg  2. Which pharmacy/location (including street and city if local pharmacy) is medication to be sent to?[facility] [address]  3. Do they need a 30 day or 90 day supply? 30 day

## 2017-04-05 NOTE — Progress Notes (Signed)
Cardiology Office Note:    Date:  04/06/2017   ID:  [name], DOB 26-Sep-1966, MRN [id number]  PCP:  Joaquim Nam, MD  Cardiologist:  Dr. Excell Seltzer   Referring MD: Joaquim Nam, MD   Chief Complaint  Patient presents with  . Follow-up    History of Present Illness:    [name] is a 51 y.o. male with a hx of Tobacco use, obesity, hypertension and CAD status post NSTEMI with DES to mid LAD 02/2012.   In May 2013 patient presented with a non-ST elevation MI and cardiac catheterization revealed severe stenosis of the LAD/diagonal bifurcation and was treated with a drug-eluting stent in his LAD and angioplasty of the diagonal. There is also minor nonobstructive disease in the left circumflex and right coronary arteries in the patient's left ventricular function was normal with EF 55%.   He was last seen in the office by Dr. Excell Seltzer in 09/2015 at which time he was doing well without any exertional symptoms. He comes today alone for routine follow up. He has been feeling well with no exertional chest pain or shortness of breath. He denies lightheadedness, palpitations, orthopnea, PND, swelling of the lower extremities or pain in the calves with walking. 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 He does not do any formal exercise. He had quit smoking in 2013 after his MI but started back 2 years later. He has been smoking 1 PPD since age 18. He drinks about 8 beers per week. He does not follow any specific diet. He has not seen his PCP in years.   Past Medical History:  Diagnosis Date  . CAD (coronary artery disease) 03-17-2012   S/P NSTEMI with PCI balloon angioplasty diagonal and DES x 1 mid LAD; normal LV function  . Hypertension   . Low back pain   . Obesity   . Tobacco abuse    a. 1 1/2 ppd x 20 yrs (02/2012)    Past Surgical History:  Procedure Laterality Date  . APPENDECTOMY    . CORONARY STENT PLACEMENT  02/2012   balloon PCI to DX; DES to mid LAD   . HERNIA REPAIR Right 2016  . INTRAVASCULAR ULTRASOUND  03/17/2012   Procedure: INTRAVASCULAR ULTRASOUND;  Surgeon: Kathleene Hazel, MD;  Location: [facility] CATH LAB;  Service: Cardiovascular;;  . LEFT HEART CATHETERIZATION WITH CORONARY ANGIOGRAM N/A 03/17/2012   Procedure: LEFT HEART CATHETERIZATION WITH CORONARY ANGIOGRAM;  Surgeon: Kathleene Hazel, MD;  Location: [facility] CATH LAB;  Service: Cardiovascular;  Laterality: N/A;    Current Medications: Current Meds  Medication Sig  . aspirin 81 MG tablet Take 81 mg by mouth daily.  . nitroGLYCERIN (NITROSTAT) 0.4 MG SL tablet Place 1 tablet (0.4 mg total) under the tongue every 5 (five) minutes as needed for chest pain.  . simvastatin (ZOCOR) 20 MG tablet Take 1 tablet (20 mg total) by mouth at bedtime.  . [DISCONTINUED] nitroGLYCERIN (NITROSTAT) 0.4 MG SL tablet Place 1 tablet (0.4 mg total) under the tongue every 5 (five) minutes as needed for chest pain.  . [DISCONTINUED] simvastatin (ZOCOR) 20 MG tablet Take 1 tablet (20 mg total) by mouth at bedtime. *Patient must keep 04/06/17 appointment for further refills*     Allergies:   Amoxicillin   Social History   Social History  . Marital status: Legally Separated    Spouse name: N/A  . Number of children: N/A  . Years of education: N/A  Occupational History  . [employer] SkeansPainter    Social History Main Topics  . Smoking status: Current Every Day Smoker    Packs/day: 1.00    Years: 20.00    Types: Cigarettes  . Smokeless tobacco: Never Used  . Alcohol use 4.8 oz/week    8 Cans of beer per week  . Drug use: No  . Sexual activity: Yes   Other Topics Concern  . None   Social History Narrative   [place].  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   Does not routinely exercise or adhere to any particular diet.     Family History: The patient's family history includes COPD in his mother; Cirrhosis in his brother; Diabetes in his father and mother;  Healthy in his brother; Heart attack in his father; Hypertension in his mother; Other in his brother. ROS:   Please see the history of present illness.     All other systems reviewed and are negative.  EKGs/Labs/Other Studies Reviewed:    The following studies were reviewed today:  Echo 03/19/2012 Study Conclusions  - Left ventricle: The cavity size was mildly dilated. Wall thickness was increased in a pattern of mild LVH. Systolic function was normal. The estimated ejection fraction was in the range of 55% to 60%. Wall motion was normal; there were no regional wall motion abnormalities. Left ventricular diastolic function parameters were normal. - Right atrium: Central venous pressure: 10mm Hg (est). - Pericardium, extracardiac: There was no pericardial effusion. Impressions:  - Normal pulmonary artery pressure.  Left heart catheterization 03/17/12 1.  NSTEMI 2.  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  3.  Subtotally occluded very small early diagonal branch which may be culprit for his ongoing pain. Too small for PCI 4.  Preserved LV systolic function  EKG:  EKG is  ordered today.  The ekg ordered today demonstrates NSR 69 bpm with right bundle branch block, QTC 462  Recent Labs: No results found for requested labs within last 8760 hours.   Recent Lipid Panel    Component Value Date/Time   CHOL 167 11/04/2015 0830   TRIG 291 (H) 11/04/2015 0830   HDL 33 (L) 11/04/2015 0830   CHOLHDL 5.1 (H) 11/04/2015 0830   VLDL 58 (H) 11/04/2015 0830   LDLCALC 76 11/04/2015 0830   LDLDIRECT 81.5 10/29/2013 0904    Physical Exam:    VS:  BP 120/72   Pulse 69   Ht 5\' 10"  (1.778 m)   Wt 234 lb (106.1 kg)   BMI 33.58 kg/m     Wt Readings from Last 3 Encounters:  04/06/17 234 lb (106.1 kg)  09/29/15 232 lb (105.2 kg)  08/19/14 233 lb 12.8 oz (106.1 kg)     GEN:  Well nourished, well  developed in no acute distress HEENT: Normal NECK: No JVD; No carotid bruits LYMPHATICS: No lymphadenopathy CARDIAC: RRR, no murmurs, rubs, gallops RESPIRATORY:  Clear to auscultation without rales, wheezing or rhonchi  ABDOMEN: Soft, non-tender, non-distended MUSCULOSKELETAL:  No edema; No deformity  SKIN: Warm and dry NEUROLOGIC:  Alert and oriented x 3 PSYCHIATRIC:  Normal affect   ASSESSMENT:    1. Family history of diabetes mellitus   2. Coronary artery disease involving native coronary artery of native heart without angina pectoris   3. Hyperlipidemia, unspecified hyperlipidemia type    PLAN:    In order of problems  listed above:  1. CAD -Status post non-STEMI in 2013 and DES to mid LAD and balloon angioplasty to a sub-totally occluded very small diagonal branch, too small for PCI with residual minor nonobstructive disease in left circumflex and right coronary artery -Preserved ejection fraction 55-60% (2013) -No anginal symptoms -Discussed TLC's including heart healthy diet, exercise, smoking cessation and lipid management. -Continue aspirin and statin -Follow up in 1 year   2. Hyperlipidemia -Most recent lipid panel in 10/2015 showed LDL 76 -continue simvastatin 20 mg and check lipid panel and LFT's. -Dietary changes including reduction of concentrated sweets, increase fresh fruits, vegetables and whole grains and limit processed foods.   3. Tobacco use -1PPD since  age 41. Pt had quit for 2 years after MI in 2013. -Strongly advised to quit smoking. He feels that he can do it without pharmacologic assistance. Hazards of smoking on vascular health discussed. He will contemplate cessation, not yet ready to quit.   4. Family history of diabetes in mother and father -Pt has not seen his PCP in several years. Advised to follow up regularly at least yearly. Will check A1c for cardiac risk stratification.   Medication Adjustments/Labs and Tests Ordered: Current medicines  are reviewed at length with the patient today.  Concerns regarding medicines are outlined above. Labs and tests ordered and medication changes are outlined in the patient instructions below:  There are no Patient Instructions on file for this visit.   Signed, Berton Bon, NP  04/06/2017 10:38 AM    [facility]

## 2017-04-06 ENCOUNTER — Encounter: Payer: Self-pay | Admitting: Physician Assistant

## 2017-04-06 ENCOUNTER — Ambulatory Visit (INDEPENDENT_AMBULATORY_CARE_PROVIDER_SITE_OTHER): Payer: Self-pay | Admitting: Cardiology

## 2017-04-06 VITALS — BP 120/72 | HR 69 | Ht 70.0 in | Wt 234.0 lb

## 2017-04-06 DIAGNOSIS — I2510 Atherosclerotic heart disease of native coronary artery without angina pectoris: Secondary | ICD-10-CM

## 2017-04-06 DIAGNOSIS — Z720 Tobacco use: Secondary | ICD-10-CM

## 2017-04-06 DIAGNOSIS — Z833 Family history of diabetes mellitus: Secondary | ICD-10-CM

## 2017-04-06 DIAGNOSIS — E785 Hyperlipidemia, unspecified: Secondary | ICD-10-CM

## 2017-04-06 LAB — LIPID PANEL
CHOLESTEROL TOTAL: 150 mg/dL (ref 100–199)
Chol/HDL Ratio: 4.1 ratio (ref 0.0–5.0)
HDL: 37 mg/dL — ABNORMAL LOW (ref >39)
LDL CALC: 74 mg/dL (ref 0–99)
Triglycerides: 195 mg/dL — ABNORMAL HIGH (ref 0–149)
VLDL Cholesterol Cal: 39 mg/dL (ref 5–40)

## 2017-04-06 LAB — HEPATIC FUNCTION PANEL
ALBUMIN: 4.4 g/dL (ref 3.5–5.5)
ALK PHOS: 72 IU/L (ref 39–117)
ALT: 18 IU/L (ref 0–44)
AST: 18 IU/L (ref 0–40)
BILIRUBIN TOTAL: 0.6 mg/dL (ref 0.0–1.2)
Bilirubin, Direct: 0.14 mg/dL (ref 0.00–0.40)
TOTAL PROTEIN: 6.4 g/dL (ref 6.0–8.5)

## 2017-04-06 MED ORDER — SIMVASTATIN 20 MG PO TABS: 20.0000 mg | ORAL_TABLET | Freq: Every day | ORAL | 3 refills | Status: DC

## 2017-04-06 MED ORDER — NITROGLYCERIN 0.4 MG SL SUBL: 0.4000 mg | SUBLINGUAL_TABLET | SUBLINGUAL | 3 refills | Status: DC | PRN

## 2017-04-06 NOTE — Patient Instructions (Signed)
Medication Instructions:  1. REFILLS HAVE BEEN SENT IN FOR NITROGLYCERIN AND ZOCOR  Labwork: TODAY FASTING LIPID, LIVER, A1C  Testing/Procedures: NONE ORDERED  Follow-Up: Your physician wants you to follow-up in: 1 YEAR WITH DR. Excell SeltzerOOPER. You will receive a reminder letter in the mail two months in advance. If you don't receive a letter, please call our office to schedule the follow-up appointment.   Any Other Special Instructions Will Be Listed Below (If Applicable).     If you need a refill on your cardiac medications before your next appointment, please call your pharmacy.

## 2017-04-07 LAB — HEMOGLOBIN A1C
Est. average glucose Bld gHb Est-mCnc: 103 mg/dL
Hgb A1c MFr Bld: 5.2 % (ref 4.8–5.6)

## 2017-12-14 ENCOUNTER — Emergency Department (HOSPITAL_COMMUNITY): Payer: Self-pay

## 2017-12-14 ENCOUNTER — Encounter (HOSPITAL_COMMUNITY): Payer: Self-pay | Admitting: Emergency Medicine

## 2017-12-14 ENCOUNTER — Other Ambulatory Visit: Payer: Self-pay

## 2017-12-14 ENCOUNTER — Observation Stay (HOSPITAL_COMMUNITY)
Admission: EM | Admit: 2017-12-14 | Discharge: 2017-12-15 | Disposition: A | Discharge status: Home / Self Care | Payer: Self-pay | Attending: Family Medicine | Admitting: Family Medicine

## 2017-12-14 DIAGNOSIS — M542 Cervicalgia: Secondary | ICD-10-CM | POA: Insufficient documentation

## 2017-12-14 DIAGNOSIS — E785 Hyperlipidemia, unspecified: Secondary | ICD-10-CM | POA: Diagnosis present

## 2017-12-14 DIAGNOSIS — Z7982 Long term (current) use of aspirin: Secondary | ICD-10-CM | POA: Insufficient documentation

## 2017-12-14 DIAGNOSIS — Z880 Allergy status to penicillin: Secondary | ICD-10-CM | POA: Insufficient documentation

## 2017-12-14 DIAGNOSIS — Z833 Family history of diabetes mellitus: Secondary | ICD-10-CM

## 2017-12-14 DIAGNOSIS — Z8249 Family history of ischemic heart disease and other diseases of the circulatory system: Secondary | ICD-10-CM | POA: Insufficient documentation

## 2017-12-14 DIAGNOSIS — I25119 Atherosclerotic heart disease of native coronary artery with unspecified angina pectoris: Secondary | ICD-10-CM

## 2017-12-14 DIAGNOSIS — E669 Obesity, unspecified: Secondary | ICD-10-CM | POA: Insufficient documentation

## 2017-12-14 DIAGNOSIS — R6884 Jaw pain: Secondary | ICD-10-CM | POA: Insufficient documentation

## 2017-12-14 DIAGNOSIS — I10 Essential (primary) hypertension: Secondary | ICD-10-CM | POA: Insufficient documentation

## 2017-12-14 DIAGNOSIS — F17210 Nicotine dependence, cigarettes, uncomplicated: Secondary | ICD-10-CM | POA: Insufficient documentation

## 2017-12-14 DIAGNOSIS — I2510 Atherosclerotic heart disease of native coronary artery without angina pectoris: Secondary | ICD-10-CM | POA: Diagnosis present

## 2017-12-14 DIAGNOSIS — R110 Nausea: Secondary | ICD-10-CM | POA: Insufficient documentation

## 2017-12-14 DIAGNOSIS — I252 Old myocardial infarction: Secondary | ICD-10-CM | POA: Insufficient documentation

## 2017-12-14 DIAGNOSIS — Z720 Tobacco use: Secondary | ICD-10-CM | POA: Diagnosis present

## 2017-12-14 DIAGNOSIS — Z955 Presence of coronary angioplasty implant and graft: Secondary | ICD-10-CM | POA: Insufficient documentation

## 2017-12-14 DIAGNOSIS — I4510 Unspecified right bundle-branch block: Secondary | ICD-10-CM | POA: Insufficient documentation

## 2017-12-14 DIAGNOSIS — Z79899 Other long term (current) drug therapy: Secondary | ICD-10-CM | POA: Insufficient documentation

## 2017-12-14 DIAGNOSIS — R0789 Other chest pain: Principal | ICD-10-CM | POA: Insufficient documentation

## 2017-12-14 DIAGNOSIS — Z6832 Body mass index (BMI) 32.0-32.9, adult: Secondary | ICD-10-CM | POA: Insufficient documentation

## 2017-12-14 DIAGNOSIS — R079 Chest pain, unspecified: Secondary | ICD-10-CM | POA: Diagnosis present

## 2017-12-14 DIAGNOSIS — E7800 Pure hypercholesterolemia, unspecified: Secondary | ICD-10-CM

## 2017-12-14 LAB — CBC
HCT: 44.7 % (ref 39.0–52.0)
HEMOGLOBIN: 15.1 g/dL (ref 13.0–17.0)
MCH: 30.4 pg (ref 26.0–34.0)
MCHC: 33.8 g/dL (ref 30.0–36.0)
MCV: 90.1 fL (ref 78.0–100.0)
Platelets: 257 10*3/uL (ref 150–400)
RBC: 4.96 MIL/uL (ref 4.22–5.81)
RDW: 12.8 % (ref 11.5–15.5)
WBC: 12.3 10*3/uL — ABNORMAL HIGH (ref 4.0–10.5)

## 2017-12-14 LAB — BASIC METABOLIC PANEL
Anion gap: 9 (ref 5–15)
BUN: 15 mg/dL (ref 6–20)
CALCIUM: 8.7 mg/dL — AB (ref 8.9–10.3)
CO2: 24 mmol/L (ref 22–32)
Chloride: 104 mmol/L (ref 101–111)
Creatinine, Ser: 0.98 mg/dL (ref 0.61–1.24)
GFR calc Af Amer: >60 mL/min (ref >60)
Glucose, Bld: 104 mg/dL — ABNORMAL HIGH (ref 65–99)
Potassium: 4.6 mmol/L (ref 3.5–5.1)
Sodium: 137 mmol/L (ref 135–145)

## 2017-12-14 LAB — TROPONIN I: Troponin I: <0.03 ng/mL (ref <0.03)

## 2017-12-14 LAB — RAPID URINE DRUG SCREEN, HOSP PERFORMED
Amphetamines: NOT DETECTED
BENZODIAZEPINES: NOT DETECTED
Barbiturates: NOT DETECTED
Cocaine: NOT DETECTED
Opiates: POSITIVE — AB
Tetrahydrocannabinol: NOT DETECTED

## 2017-12-14 LAB — I-STAT TROPONIN, ED: TROPONIN I, POC: 0 ng/mL (ref 0.00–0.08)

## 2017-12-14 MED ORDER — FENTANYL CITRATE (PF) 100 MCG/2ML IJ SOLN
25.0000 ug | Freq: Once | INTRAMUSCULAR | Status: AC
  Administered 2017-12-14: 25 ug via INTRAVENOUS
  Filled 2017-12-14: qty 2

## 2017-12-14 MED ORDER — ASPIRIN EC 81 MG PO TBEC
81.0000 mg | DELAYED_RELEASE_TABLET | Freq: Every day | ORAL | Status: DC
  Administered 2017-12-15: 81 mg via ORAL
  Filled 2017-12-14: qty 1

## 2017-12-14 MED ORDER — ENOXAPARIN SODIUM 40 MG/0.4ML ~~LOC~~ SOLN
40.0000 mg | SUBCUTANEOUS | Status: DC
  Administered 2017-12-14: 40 mg via SUBCUTANEOUS
  Filled 2017-12-14 (×2): qty 0.4

## 2017-12-14 MED ORDER — GUAIFENESIN ER 600 MG PO TB12
600.0000 mg | ORAL_TABLET | Freq: Two times a day (BID) | ORAL | Status: DC
  Administered 2017-12-14 – 2017-12-15 (×2): 600 mg via ORAL
  Filled 2017-12-14 (×2): qty 1

## 2017-12-14 MED ORDER — ACETAMINOPHEN 325 MG PO TABS: 650.0000 mg | ORAL_TABLET | ORAL | Status: DC | PRN

## 2017-12-14 MED ORDER — SIMVASTATIN 20 MG PO TABS
20.0000 mg | ORAL_TABLET | Freq: Every day | ORAL | Status: DC
  Administered 2017-12-14: 20 mg via ORAL
  Filled 2017-12-14: qty 1

## 2017-12-14 MED ORDER — NICOTINE 21 MG/24HR TD PT24
21.0000 mg | MEDICATED_PATCH | Freq: Every day | TRANSDERMAL | Status: DC
  Administered 2017-12-14 – 2017-12-15 (×2): 21 mg via TRANSDERMAL
  Filled 2017-12-14 (×2): qty 1

## 2017-12-14 MED ORDER — ASPIRIN 81 MG PO CHEW
324.0000 mg | CHEWABLE_TABLET | Freq: Once | ORAL | Status: AC
  Administered 2017-12-14: 324 mg via ORAL
  Filled 2017-12-14: qty 4

## 2017-12-14 MED ORDER — MORPHINE SULFATE (PF) 4 MG/ML IV SOLN: 2.0000 mg | INTRAVENOUS | Status: DC | PRN

## 2017-12-14 MED ORDER — SODIUM CHLORIDE 0.9 % IV BOLUS (SEPSIS)
500.0000 mL | Freq: Once | INTRAVENOUS | Status: AC
  Administered 2017-12-14: 500 mL via INTRAVENOUS

## 2017-12-14 MED ORDER — PREDNISONE 10 MG PO TABS
10.0000 mg | ORAL_TABLET | Freq: Every day | ORAL | Status: DC
  Administered 2017-12-14 – 2017-12-15 (×2): 10 mg via ORAL
  Filled 2017-12-14 (×2): qty 1

## 2017-12-14 MED ORDER — ONDANSETRON HCL 4 MG/2ML IJ SOLN: 4.0000 mg | Freq: Four times a day (QID) | INTRAMUSCULAR | Status: DC | PRN

## 2017-12-14 NOTE — ED Notes (Signed)
Admitting MD at bedside.

## 2017-12-14 NOTE — ED Provider Notes (Signed)
[facility] EMERGENCY DEPARTMENT Provider Note   CSN: [id number] Arrival date & time: 12/14/17  1722     History   Chief Complaint Chief Complaint  Patient presents with  . Chest Pain    HPI [name] is a 52 y.o. male.  The history is provided by the patient and medical records. No language interpreter was used.  Chest Pain   Associated symptoms include diaphoresis, nausea and shortness of breath. Pertinent negatives include no abdominal pain, no cough, no fever, no palpitations and no vomiting.   [name] is a 52 y.o. male  with a PMH of CAD s/p PCI, HTN who presents to the Emergency Department complaining of acute onset of substernal chest pain at 4 PM today.  Pain radiates up both sides of the jaw.  Associated with nausea and diaphoresis.  Patient reports pain is similar to his previous MI about 5 years ago.  He was given 4 nitroglycerin prior to arrival by EMS and is currently chest pain-free.  He was also given about 100 cc normal saline due to hypotension.  He currently is complaining of a headache, but reports medication has relieved his symptoms.  Past Medical History:  Diagnosis Date  . CAD (coronary artery disease) 03-17-2012   S/P NSTEMI with PCI balloon angioplasty diagonal and DES x 1 mid LAD; normal LV function  . Hypertension   . Low back pain   . Obesity   . Tobacco abuse    a. 1 1/2 ppd x 20 yrs (02/2012)    Patient Active Problem List   Diagnosis Date Noted  . Administrative encounter 11/20/2012  . Hematuria 10/24/2012  . Back pain 10/16/2012  . CAD (coronary artery disease) 04/03/2012  . TOBACCO ABUSE 10/22/2009  . GENITAL HERPES 05/29/2009  . GENITAL HERPES, HX OF 09/16/2008    Past Surgical History:  Procedure Laterality Date  . APPENDECTOMY    . CORONARY STENT PLACEMENT  02/2012   balloon PCI to DX; DES to mid LAD  . HERNIA REPAIR Right 2016  . INTRAVASCULAR ULTRASOUND  03/17/2012   Procedure: INTRAVASCULAR  ULTRASOUND;  Surgeon: Kathleene Hazel, MD;  Location: [facility] CATH LAB;  Service: Cardiovascular;;  . LEFT HEART CATHETERIZATION WITH CORONARY ANGIOGRAM N/A 03/17/2012   Procedure: LEFT HEART CATHETERIZATION WITH CORONARY ANGIOGRAM;  Surgeon: Kathleene Hazel, MD;  Location: [facility] CATH LAB;  Service: Cardiovascular;  Laterality: N/A;       Home Medications    Prior to Admission medications   Medication Sig Start Date End Date Taking? Authorizing Provider  aspirin 81 MG tablet Take 81 mg by mouth daily.    [provider]  nitroGLYCERIN (NITROSTAT) 0.4 MG SL tablet Place 1 tablet (0.4 mg total) under the tongue every 5 (five) minutes as needed for chest pain. 04/06/17 04/29/20  Berton Bon, NP  simvastatin (ZOCOR) 20 MG tablet Take 1 tablet (20 mg total) by mouth at bedtime. 04/06/17   Berton Bon, NP    Family History Family History  Problem Relation Age of Onset  . Heart attack Father        3 mi's - first in 35's.  died @ 16.  . Diabetes Father   . Hypertension Mother        alive  . Diabetes Mother   . COPD Mother   . Other Unknown        2 brothers alive & well  . Healthy Brother   . Other Brother  car accident  . Cirrhosis Brother     Social History Social History   Tobacco Use  . Smoking status: Current Every Day Smoker    Packs/day: 1.00    Years: 20.00    Pack years: 20.00    Types: Cigarettes  . Smokeless tobacco: Never Used  Substance Use Topics  . Alcohol use: Yes    Alcohol/week: 4.8 oz    Types: 8 Cans of beer per week  . Drug use: No     Allergies   Amoxicillin   Review of Systems Review of Systems  Constitutional: Positive for diaphoresis. Negative for chills and fever.  Respiratory: Positive for shortness of breath. Negative for cough and wheezing.   Cardiovascular: Positive for chest pain. Negative for palpitations and leg swelling.  Gastrointestinal: Positive for nausea. Negative for abdominal pain, constipation,  diarrhea and vomiting.  All other systems reviewed and are negative.    Physical Exam Updated Vital Signs BP 105/68   Pulse 73   Temp 98.7 F (37.1 C) (Oral)   Resp 17   Ht 5\' 11"  (1.803 m)   Wt 106.6 kg (235 lb)   SpO2 97%   BMI 32.78 kg/m   Physical Exam  Constitutional: He is oriented to person, place, and time. He appears well-developed and well-nourished. No distress.  HENT:  Head: Normocephalic and atraumatic.  Neck: Neck supple. No JVD present.  Cardiovascular: Normal rate, regular rhythm and normal heart sounds.  No murmur heard. Pulmonary/Chest: Effort normal and breath sounds normal. No respiratory distress. He has no wheezes. He has no rales. He exhibits no tenderness.  Abdominal: Soft. He exhibits no distension. There is no tenderness.  Musculoskeletal: He exhibits no edema.  Neurological: He is alert and oriented to person, place, and time.  Skin: Skin is warm and dry.  Nursing note and vitals reviewed.    ED Treatments / Results  Labs (all labs ordered are listed, but only abnormal results are displayed) Labs Reviewed  BASIC METABOLIC PANEL - Abnormal; Notable for the following components:      Result Value   Glucose, Bld 104 (*)    Calcium 8.7 (*)    All other components within normal limits  CBC - Abnormal; Notable for the following components:   WBC 12.3 (*)    All other components within normal limits  I-STAT TROPONIN, ED    EKG  EKG Interpretation  Date/Time:  Wednesday December 14 2017 17:27:21 EST Ventricular Rate:  70 PR Interval:    QRS Duration: 158 QT Interval:  429 QTC Calculation: 463 R Axis:   94 Text Interpretation:  Sinus rhythm RBBB and LPFB Subtle changes in IVCD vs 2013. No concorant or acute changes/No STE Confirmed by Rolland Porter ([id number]) on 12/14/2017 6:42:22 PM       Radiology Dg Chest Portable 1 View  Result Date: 12/14/2017 CLINICAL DATA:  52 year old male with generalized chest pain radiating into the jaw and  neck with nausea for 1 day. EXAM: PORTABLE CHEST 1 VIEW COMPARISON:  Chest CT 03/17/2012. FINDINGS: Lung volumes are normal. No consolidative airspace disease. No pleural effusions. No pneumothorax. No pulmonary nodule or mass noted. Pulmonary vasculature and the cardiomediastinal silhouette are within normal limits. IMPRESSION: No radiographic evidence of acute cardiopulmonary disease. Electronically Signed   By: Trudie Reed M.D.   On: 12/14/2017 17:59    Procedures Procedures (including critical care time)  Medications Ordered in ED Medications  fentaNYL (SUBLIMAZE) injection 25 mcg (25 mcg Intravenous Given 12/14/17  1745)  aspirin chewable tablet 324 mg (324 mg Oral Given 12/14/17 1850)  sodium chloride 0.9 % bolus 500 mL (0 mLs Intravenous Stopped 12/14/17 1851)     Initial Impression / Assessment and Plan / ED Course  I have reviewed the triage vital signs and the nursing notes.  Pertinent labs & imaging results that were available during my care of the patient were reviewed by me and considered in my medical decision making (see chart for details).    [name] is a 52 y.o. male who presents to ED for acute onset of substernal chest pain which radiates to the jaw.  Pain similar to pain he experienced with prior MI.  Nitro given prior to arrival with improvement.  He is currently chest pain-free. ASA given. Initial troponin negative.  EKG with no ST changes.  Chest x-ray negative. Chest pain high risk for cardiac etiology. Hospitalist consulted who will admit.   Final Clinical Impressions(s) / ED Diagnoses   Final diagnoses:  Chest pain with high risk for cardiac etiology    ED Discharge Orders    None       Shanty Ginty, Chase PicketJaime Pilcher, PA-C 12/14/17 1918    Rolland PorterJames, Mark, MD 12/14/17 2249

## 2017-12-14 NOTE — H&P (Signed)
[name]:[id number] DOB: [birth year] DOA: 12/14/2017     PCP: Joaquim Namuncan, Graham S, MD   Outpatient Specialists: Cardiology Excell Seltzerooper   Patient coming from:  home Lives  With family    Chief Complaint: Chest pain  HPI: Lance SprayJay Rodney Hicks is a 52 y.o. male with medical history significant of CAD, HLD, tobacco abuse    Presented with sudden onset of chest pain around 4 PM radiated to his jaw reminiscent of previous MI but milder. At the onset he was cleaning the bottom of some cabinets. Reports non strenuous activity. He is active but does not develop chest pain with activity. Pain lasted 20-30 min felt like pressure. He called 911 EMS administered for nitroglycerin resulting in hypotension and needing IV fluids initially BP 95/65. Described as substernal associated some nausea and diaphoresis no vomiting chest pain currently result of that he's been having some headaches since nitroglycerin was given. Aspirin has been administered    [facility] and was started on steroids, patient continues to smoke about half a pack a day  Has known history of coronary disease cold by Dr. Excell Seltzerooper of cardiology aspirin and simvastatin 20 mg a day. In the past patient has suffered NSTEMI into fossa 13 requiring DES to mid LAD at that time EF 55%. He has been doing well up until today  While in ER: Chest pain has resolved Significant initial  Findings: First troponin within normal limits  EKG without evidence of ischemia. Chest Xray unremarkable  WBC 12 likely due to recent steroids  K 4.6   IN ER:  Temp (24hrs), Avg:98.7 F (37.1 C), Min:98.7 F (37.1 C), Max:98.7 F (37.1 C)      on arrival  ED Triage Vitals  Enc Vitals Group     BP 12/14/17 1728 99/63     Pulse Rate 12/14/17 1728 79     Resp 12/14/17 1728 15     Temp 12/14/17 1728 98.7 F (37.1 C)     Temp Source 12/14/17 1728 Oral     SpO2 12/14/17 1728 96 %     Weight  12/14/17 1727 235 lb (106.6 kg)     Height 12/14/17 1727 5\' 11"  (1.803 m)     Head Circumference --      Peak Flow --      Pain Score --      Pain Loc --      Pain Edu? --      Excl. in GC? --     Latest RR17  97% 73 BP 105/68  Following Medications were ordered in ER: Medications  fentaNYL (SUBLIMAZE) injection 25 mcg (25 mcg Intravenous Given 12/14/17 1745)  aspirin chewable tablet 324 mg (324 mg Oral Given 12/14/17 1850)  sodium chloride 0.9 % bolus 500 mL (0 mLs Intravenous Stopped 12/14/17 1851)      Hospitalist was called for admission for chest pain evaluation     Review of Systems:    Pertinent positives include:  Headaches nausea,   chest pain, Constitutional:  No weight loss, night sweats, Fevers, chills, fatigue, weight loss  HEENT:  No, Difficulty swallowing,Tooth/dental problems,Sore throat,  No sneezing, itching, ear ache, nasal congestion, post nasal drip,  Cardio-vascular:  No Orthopnea, PND, anasarca, dizziness, palpitations.no Bilateral lower extremity swelling  GI:  No heartburn, indigestion, abdominal pain, vomiting, diarrhea, change in bowel habits, loss of appetite, melena, blood in stool, hematemesis Resp:  no shortness of  breath at rest. No dyspnea on exertion, No excess mucus, no productive cough, No non-productive cough, No coughing up of blood.No change in color of mucus.No wheezing. Skin:  no rash or lesions. No jaundice GU:  no dysuria, change in color of urine, no urgency or frequency. No straining to urinate.  No flank pain.  Musculoskeletal:  No joint pain or no joint swelling. No decreased range of motion. No back pain.  Psych:  No change in mood or affect. No depression or anxiety. No memory loss.  Neuro: no localizing neurological complaints, no tingling, no weakness, no double vision, no gait abnormality, no slurred speech, no confusion  As per HPI otherwise 10 point review of systems negative.   Past Medical History: Past Medical  History:  Diagnosis Date  . CAD (coronary artery disease) 03-17-2012   S/P NSTEMI with PCI balloon angioplasty diagonal and DES x 1 mid LAD; normal LV function  . Hypertension   . Low back pain   . Obesity   . Tobacco abuse    a. 1 1/2 ppd x 20 yrs (02/2012)   Past Surgical History:  Procedure Laterality Date  . APPENDECTOMY    . CORONARY STENT PLACEMENT  02/2012   balloon PCI to DX; DES to mid LAD  . HERNIA REPAIR Right 2016  . INTRAVASCULAR ULTRASOUND  03/17/2012   Procedure: INTRAVASCULAR ULTRASOUND;  Surgeon: Kathleene Hazel, MD;  Location: [facility] CATH LAB;  Service: Cardiovascular;;  . LEFT HEART CATHETERIZATION WITH CORONARY ANGIOGRAM N/A 03/17/2012   Procedure: LEFT HEART CATHETERIZATION WITH CORONARY ANGIOGRAM;  Surgeon: Kathleene Hazel, MD;  Location: [facility] CATH LAB;  Service: Cardiovascular;  Laterality: N/A;     Social History:  Ambulatory   Independently    reports that he has been smoking cigarettes.  He has a 20.00 pack-year smoking history. he has never used smokeless tobacco. He reports that he drinks about 4.8 oz of alcohol per week. He reports that he does not use drugs.  Allergies:   Allergies  Allergen Reactions  . Amoxicillin     Rash        Family History:   Family History  Problem Relation Age of Onset  . Heart attack Father        3 mi's - first in 71's.  died @ 78.  . Diabetes Father   . Hypertension Mother        alive  . Diabetes Mother   . COPD Mother   . Other Unknown        2 brothers alive & well  . Healthy Brother   . Other Brother        car accident  . Cirrhosis Brother     Medications: Prior to Admission medications   Medication Sig Start Date End Date Taking? Authorizing Provider  aspirin 81 MG tablet Take 81 mg by mouth daily.    [provider]  nitroGLYCERIN (NITROSTAT) 0.4 MG SL tablet Place 1 tablet (0.4 mg total) under the tongue every 5 (five) minutes as needed for chest pain. 04/06/17 04/29/20  Berton Bon, NP  simvastatin (ZOCOR) 20 MG tablet Take 1 tablet (20 mg total) by mouth at bedtime. 04/06/17   Berton Bon, NP    Physical Exam: Patient Vitals for the past 24 hrs:  BP Temp Temp src Pulse Resp SpO2 Height Weight  12/14/17 1830 105/68 - - 73 17 97 % - -  12/14/17 1815 98/60 - - 71 (!) 22 99 % - -  12/14/17 1800 101/64 - - 80 16 96 % - -  12/14/17 1745 99/65 - - 68 16 96 % - -  12/14/17 1730 (!) 111/59 - - 78 14 98 % - -  12/14/17 1728 99/63 98.7 F (37.1 C) Oral 79 15 96 % - -  12/14/17 1727 - - - - - - 5\' 11"  (1.803 m) 106.6 kg (235 lb)    1. General:  in No Acute distress  well -appearing 2. Psychological: Alert and   Oriented 3. Head/ENT:     Dry Mucous Membranes                          Head Non traumatic, neck supple                           Poor Dentition 4. SKIN:   decreased Skin turgor,  Skin clean Dry and intact no rash 5. Heart: Regular rate and rhythm no  Murmur, no Rub or gallop 6. Lungs: coarse breath sounds no wheezes or crackles   7. Abdomen: Soft,  non-tender, Non distended   8. Lower extremities: no clubbing, cyanosis, or edema 9. Neurologically Grossly intact, moving all 4 extremities equally   10. MSK: Normal range of motion   body mass index is 32.78 kg/m.  Labs on Admission:   Labs on Admission: I have personally reviewed following labs and imaging studies  CBC: Recent Labs  Lab 12/14/17 1725  WBC 12.3*  HGB 15.1  HCT 44.7  MCV 90.1  PLT 257   Basic Metabolic Panel: Recent Labs  Lab 12/14/17 1725  NA 137  K 4.6  CL 104  CO2 24  GLUCOSE 104*  BUN 15  CREATININE 0.98  CALCIUM 8.7*   GFR: Estimated Creatinine Clearance: 110.7 mL/min (by C-G formula based on SCr of 0.98 mg/dL). Liver Function Tests: No results for input(s): AST, ALT, ALKPHOS, BILITOT, PROT, ALBUMIN in the last 168 hours. No results for input(s): LIPASE, AMYLASE in the last 168 hours. No results for input(s): AMMONIA in the last 168 hours. Coagulation  Profile: No results for input(s): INR, PROTIME in the last 168 hours. Cardiac Enzymes: No results for input(s): CKTOTAL, CKMB, CKMBINDEX, TROPONINI in the last 168 hours. BNP (last 3 results) No results for input(s): PROBNP in the last 8760 hours. HbA1C: No results for input(s): HGBA1C in the last 72 hours. CBG: No results for input(s): GLUCAP in the last 168 hours. Lipid Profile: No results for input(s): CHOL, HDL, LDLCALC, TRIG, CHOLHDL, LDLDIRECT in the last 72 hours. Thyroid Function Tests: No results for input(s): TSH, T4TOTAL, FREET4, T3FREE, THYROIDAB in the last 72 hours. Anemia Panel: No results for input(s): VITAMINB12, FOLATE, FERRITIN, TIBC, IRON, RETICCTPCT in the last 72 hours.    Sepsis Labs: @LABRCNTIP (procalcitonin:4,lacticidven:4) )No results found for this or any previous visit (from the past 240 hour(s)).      UA not ordered  Lab Results  Component Value Date   HGBA1C 5.2 04/06/2017    Estimated Creatinine Clearance: 110.7 mL/min (by C-G formula based on SCr of 0.98 mg/dL).  BNP (last 3 results) No results for input(s): PROBNP in the last 8760 hours.   ECG REPORT  Independently reviewed Rate:70  Rhythm: RBBB ST&T Change: No acute ischemic changes    QTC 463  Filed Weights   12/14/17 1727  Weight: 106.6 kg (235 lb)     Cultures: No results found for:  SDES, SPECREQUEST, CULT, REPTSTATUS   Radiological Exams on Admission: Dg Chest Portable 1 View  Result Date: 12/14/2017 CLINICAL DATA:  51 year old male with generalized chest pain radiating into the jaw and neck with nausea for 1 day. EXAM: PORTABLE CHEST 1 VIEW COMPARISON:  Chest CT 03/17/2012. FINDINGS: Lung volumes are normal. No consolidative airspace disease. No pleural effusions. No pneumothorax. No pulmonary nodule or mass noted. Pulmonary vasculature and the cardiomediastinal silhouette are within normal limits. IMPRESSION: No radiographic evidence of acute cardiopulmonary disease.  Electronically Signed   By: Trudie Reed M.D.   On: 12/14/2017 17:59    Chart has been reviewed    Assessment/Plan  52 y.o. male with medical history significant of CAD, HLD, tobacco abuse     Admitted for chest pain evaluation  Present on Admission: . Chest pain - - H=   2   ,E= 1 ,  A=  1 , R   2   , T 0   for the  Total of 5  therefore will admit for observation and further evaluation ( Risk of MACE: Scores 0-3  of 0.9-1.7%.,  4-6: 12-16.6% , Scores ?7: 50-65% )      - monitor on telemetry, cycle cardiac enzymes, obtain serial ECG and  ECHO in AM.   - Daily aspirin -  Further risk stratify with lipid panel, hgA1C, obtain TSH.  Make sure patient is on Aspirin.  We will notify cardiology regarding patient's admission. Further management depends on pending  workup  . CAD (coronary artery disease) -  - chronic, continue aspirin  and statin will email cardiology given recent chest pain worrisome for angina  . Tobacco abuse -  - Spoke about importance of quitting,   - order nicotine patch   - nursing tobacco cessation protocol  . Hyperlipidemia - chronic continue home medications   Other plan as per orders.  DVT prophylaxis:    Lovenox     Code Status:  FULL CODE as per patient    Family Communication:   Family   at  Bedside    Disposition Plan:      To home once workup is complete and patient is stable   Consults called: emailed cardiology    Admission status: obs   Level of care    tele             I have spent a total of 56 min on this admission   Jamisha Hoeschen 12/14/2017, 7:54 PM    [facility]  Pager [phone]   after 2 AM please page floor coverage PA If 7AM-7PM, please contact the day team taking care of the patient  [email or web address]  Password [email or web address]

## 2017-12-14 NOTE — ED Triage Notes (Signed)
Pt arrived [facility] with reports of Sudden onset of CP and nausea at 1600 substernal radiating to jaw and neck pt reports it felt similar to past MI pain. Pt given 4 nitro PTA 18RAC given of NS due to hypotension following nitro. Vitals BP 95/65 P 75 RR O2 94%

## 2017-12-15 ENCOUNTER — Observation Stay (HOSPITAL_BASED_OUTPATIENT_CLINIC_OR_DEPARTMENT_OTHER): Payer: Self-pay

## 2017-12-15 ENCOUNTER — Other Ambulatory Visit: Payer: Self-pay | Admitting: Cardiology

## 2017-12-15 DIAGNOSIS — I361 Nonrheumatic tricuspid (valve) insufficiency: Secondary | ICD-10-CM

## 2017-12-15 DIAGNOSIS — I2510 Atherosclerotic heart disease of native coronary artery without angina pectoris: Secondary | ICD-10-CM

## 2017-12-15 DIAGNOSIS — R0789 Other chest pain: Secondary | ICD-10-CM

## 2017-12-15 DIAGNOSIS — R079 Chest pain, unspecified: Secondary | ICD-10-CM

## 2017-12-15 DIAGNOSIS — Z720 Tobacco use: Secondary | ICD-10-CM

## 2017-12-15 LAB — LIPID PANEL
CHOLESTEROL: 118 mg/dL (ref 0–200)
HDL: 31 mg/dL — AB (ref >40)
LDL Cholesterol: 58 mg/dL (ref 0–99)
TRIGLYCERIDES: 145 mg/dL (ref <150)
Total CHOL/HDL Ratio: 3.8 RATIO
VLDL: 29 mg/dL (ref 0–40)

## 2017-12-15 LAB — ECHOCARDIOGRAM COMPLETE
Height: 71 in
WEIGHTICAEL: 3760 [oz_av]

## 2017-12-15 LAB — TROPONIN I
Troponin I: <0.03 ng/mL (ref <0.03)
Troponin I: <0.03 ng/mL (ref <0.03)

## 2017-12-15 LAB — TSH: TSH: 0.297 u[IU]/mL — ABNORMAL LOW (ref 0.350–4.500)

## 2017-12-15 LAB — HIV ANTIBODY (ROUTINE TESTING W REFLEX): HIV Screen 4th Generation wRfx: NONREACTIVE

## 2017-12-15 MED ORDER — NITROGLYCERIN 0.4 MG SL SUBL: 0.4000 mg | SUBLINGUAL_TABLET | SUBLINGUAL | 0 refills | Status: DC | PRN

## 2017-12-15 MED ORDER — METOPROLOL SUCCINATE ER 25 MG PO TB24: 25.0000 mg | ORAL_TABLET | Freq: Every day | ORAL | 0 refills | Status: DC

## 2017-12-15 NOTE — Discharge Instructions (Signed)
° °  You have a Stress Test scheduled at [facility]. Your doctor has ordered this test to check the blood flow in your heart arteries. ° °Please arrive 15 minutes early for paperwork. The whole test will take several hours. You may want to bring reading material to remain occupied while undergoing different parts of the test. ° °Instructions: °· No food/drink after midnight the night before. °· It is OK to take your morning meds with a sip of water EXCEPT for those types of medicines listed below or otherwise instructed. °· No caffeine/decaf products 24 hours before, including medicines such as Excedrin or Goody Powders. Call if there are any questions.  °· Wear comfortable clothes and shoes.  ° °Special Medication Instructions: °· Beta blockers such as metoprolol (Lopressor/Toprol XL), atenolol (Tenormin), carvedilol (Coreg), nebivolol (Bystolic), bisoprolol (Zebeta), propranolol (Inderal) should not be taken for 24 hours before the test. °· Calcium channel blockers such as diltiazem (Cardizem) or verapmil (Calan) should not be taken for 24 hours before the test. °· Remove nitroglycerin patches and do not take nitrate preparations such as Imdur/isosorbide the day of your test. °· No Persantine/Theophylline or Aggrenox medicines should be used within 24 hours of the test.  °· If you are diabetic, please ask which medications to hold the day of the test ° °What To Expect: °When you arrive in the lab, the technician will inject a small amount of radioactive tracer into your arm through an IV while you are resting quietly. This helps us to form pictures of your heart. You will likely only feel a sting from the IV. After a waiting period, resting pictures will be obtained under a big camera. These are the "before" pictures. ° °Next, you will be prepped for the stress portion of the test. This may include either walking on a treadmill or receiving a medicine that helps to dilate blood vessels in  your heart to simulate the effect of exercise on your heart. If you are walking on a treadmill, you will walk at different paces to try to get your heart rate to a goal number that is based on your age. If your doctor has chosen the pharmacologic test, then you will receive a medicine through your IV that may cause temporary nausea, flushing, shortness of breath and sometimes chest discomfort or vomiting. This is typically short-lived and usually resolves quickly. If you experience symptoms, that does not automatically mean the test is abnormal. Some patients do not experience any symptoms at all. Your blood pressure and heart rate will be monitored, and we will be watching your EKG on a computer screen for any changes. During this portion of the test, the radiologist will inject another small amount of radioactive tracer into your IV. After a waiting period, you will undergo a second set of pictures. These are the "after" pictures. ° °The doctor reading the test will compare the before-and-after images to look for evidence of heart blockages or heart weakness. The test usually takes 1 day to complete, but in certain instances (for example, if a patient is over a certain weight limit), the test may be done over the span of 2 days. ° ° °

## 2017-12-15 NOTE — ED Notes (Signed)
Lunch tray ordered. [place] sand and drink given

## 2017-12-15 NOTE — Progress Notes (Signed)
  PROGRESS NOTE  Lance SprayJay Rodney Goodwin NWG:[id number] DOB: 13-Jan-1966 DOA: 12/14/2017 PCP: Joaquim Namuncan, Graham S, MD  Brief Narrative: [age]yom PMH CAD presented with chest pain. Admitted for further evaluation.   Assessment/Plan Chest pain.  - resolved. Troponins negative, ACS ruled out, EKG without acute changes. 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  - cardiology recommends outpt stress test, f/u echo, if unremarkable can d/c home.  CAD - continue ASA, statin - new Rx for NTG rec by cardiology  - Toprol XL 25 mg daily at discharge per cardiology but hold for stress test  Essential HTN - stable  Smoker - recommend cessation   F/u echo, if unremarkable will d/c home  DVT prophylaxis: enoxaparin Code Status: full Family Communication:  Disposition Plan: home    Brendia Sacksaniel Mirielle Byrum, MD  [facility] Direct contact: [phone] --Via amion app OR  --[email or web address]; password [email or web address]  7PM-7AM contact night coverage as above 12/15/2017, 12:47 PM  LOS: 0 days   Consultants:  Cardiology   Procedures:  Echo   Antimicrobials:    Interval history/Subjective: Feels fine. No chest pain.  Objective: Vitals:  Vitals:   12/15/17 1124 12/15/17 1203  BP: 120/78 120/76  Pulse: 63 68  Resp: 16 17  Temp:    SpO2: 95% 100%    Exam:  Constitutional:  . Appears calm and comfortable Respiratory:  . CTA bilaterally, no w/r/r.  . Respiratory effort normal Cardiovascular:  . RRR, no m/r/g . No LE extremity edema   Psychiatric:  . Mental status o Mood, affect appropriate  I have personally reviewed the following:   Labs:  Lipid profile noted  BMP unremarkable  Troponins negative  CBC noted  Imaging studies:  CXR NAD  Medical tests:  EKG SR old RBBB   Scheduled Meds: . aspirin EC  81 mg Oral Daily  . enoxaparin (LOVENOX) injection  40 mg Subcutaneous Q24H  . guaiFENesin  600 mg Oral BID  . nicotine  21 mg Transdermal Daily  . predniSONE  10 mg Oral Daily  . simvastatin  20 mg Oral  QHS   Continuous Infusions:  Active Problems:   CAD (coronary artery disease)   Chest pain   Tobacco abuse   Hyperlipidemia   LOS: 0 days

## 2017-12-15 NOTE — ED Notes (Signed)
Pt eating lunch

## 2017-12-15 NOTE — ED Notes (Signed)
Pt waiting for ride (possibly wife?) dressed & updated vitals.

## 2017-12-15 NOTE — Consult Note (Addendum)
Cardiology Consultation:   Patient ID: [name]; [id number]; 16-Jan-1966   Admit date: 12/14/2017 Date of Consult: 12/15/2017  Primary Care Provider: Joaquim Nam, MD Primary Cardiologist: Tonny Bollman, MD   Patient Profile:   [name] is a 52 y.o. male with a hx of tobacco use, obesity, hypertension and CAD s/p NSTEMI with DES to mid LAD 02/2012, RBBB who is being seen today for the evaluation of chest pain at the request of Dr. Irene Limbo.  History of Present Illness:   [name] has a history of non-STEMI in 2013 and DES to mid LAD and balloon angioplasty to a sub-totally occluded very small diagonal branch, too small for PCI with residual minor nonobstructive disease in left circumflex and right coronary artery. He had preserved ejection fraction 55-60% (2013).   He presented to the [facility] ED via EMS yesterday with complaints of chest pain that radiated to his jaw reminiscent of his previous MI but milder and began while he was cleaning the bottom of some cabinets. The pain lasted 20-30 minutes. EMS administered NTG X 4 that resulted in hypotension needing IV fluids for BP 95/65.   [name] says that the chest pain was intermittently sharp with constant mild pressure and did not radiate as with his previous MI while on his hands and knees cleaning lower cabinets.  He had no dyspnea or lightheadedness. He did have mild nausea, no vomiting. The pain resolved in the ambulance prior to arrival to the hospital and has not reoccurred. He is having some burning in his throat and a bad taste and he wonders if he is having heart burn. He has never been bothered by reflux.  He is active at his job carrying things up and down stairs without any recent exertional chest discomfort or doe. He denies orthopnea, PND or edema.   He continues to smoke about 1/2 PPD. He was recently seen at [facility] for nasal congestion and cough that has been going on for about 2 weeks and was started on  steroids. His girlfriend who is present says that the patient stresses a lot over work and bills. He drinks a couple of beers 2-3 times per week. He is compliant with his meds.   [name] was last seen in our office by me at which time he had no exertional symptoms. He continued to smoke.   Past Medical History:  Diagnosis Date  . CAD (coronary artery disease) 03-17-2012   S/P NSTEMI with PCI balloon angioplasty diagonal and DES x 1 mid LAD; normal LV function  . Hypertension   . Low back pain   . Obesity   . Tobacco abuse    a. 1 1/2 ppd x 20 yrs (02/2012)    Past Surgical History:  Procedure Laterality Date  . APPENDECTOMY    . CORONARY STENT PLACEMENT  02/2012   balloon PCI to DX; DES to mid LAD  . HERNIA REPAIR Right 2016  . INTRAVASCULAR ULTRASOUND  03/17/2012   Procedure: INTRAVASCULAR ULTRASOUND;  Surgeon: Kathleene Hazel, MD;  Location: [facility] C[facility] LAB;  Service: Cardiovascular;;  . LEFT HEART CATHETERIZATION WITH CORONARY ANGIOGRAM N/A 03/17/2012   Procedure: LEFT HEART CATHETERIZATION WITH CORONARY ANGIOGRAM;  Surgeon: Kathleene Hazel, MD;  Location: [facility] CATH LAB;  Service: Cardiovascular;  Laterality: N/A;     Home Medications:  Prior to Admission medications   Medication Sig Start Date End Date Taking? Authorizing Provider  aspirin 81 MG tablet Take 81 mg by mouth  daily.   Yes [provider]  benzonatate (TESSALON) 100 MG capsule Take 100 mg by mouth 3 (three) times daily. 12/06/17  Yes [provider]  nitroGLYCERIN (NITROSTAT) 0.4 MG SL tablet Place 1 tablet (0.4 mg total) under the tongue every 5 (five) minutes as needed for chest pain. 04/06/17 04/29/20 Yes Berton Bon, NP  predniSONE (DELTASONE) 10 MG tablet Take 10 mg by mouth daily. 12/06/17  Yes [provider]  simvastatin (ZOCOR) 20 MG tablet Take 1 tablet (20 mg total) by mouth at bedtime. 04/06/17  Yes Berton Bon, NP    Inpatient Medications: Scheduled Meds: . aspirin  EC  81 mg Oral Daily  . enoxaparin (LOVENOX) injection  40 mg Subcutaneous Q24H  . guaiFENesin  600 mg Oral BID  . nicotine  21 mg Transdermal Daily  . predniSONE  10 mg Oral Daily  . simvastatin  20 mg Oral QHS   Continuous Infusions:  PRN Meds: acetaminophen, morphine injection, ondansetron (ZOFRAN) IV  Allergies:    Allergies  Allergen Reactions  . Amoxicillin     Rash     Social History:   Social History   Socioeconomic History  . Marital status: Legally Separated    Spouse name: Not on file  . Number of children: Not on file  . Years of education: Not on file  . Highest education level: Not on file  Social Needs  . Financial resource strain: Not on file  . Food insecurity - worry: Not on file  . Food insecurity - inability: Not on file  . Transportation needs - medical: Not on file  . Transportation needs - non-medical: Not on file  Occupational History  . Occupation: Painter  Tobacco Use  . Smoking status: Current Every Day Smoker    Packs/day: 1.00    Years: 20.00    Pack years: 20.00    Types: Cigarettes  . Smokeless tobacco: Never Used  Substance and Sexual Activity  . Alcohol use: Yes    Alcohol/week: 4.8 oz    Types: 8 Cans of beer per week    Comment: 6 pack per week  . Drug use: No  . Sexual activity: Yes  Other Topics Concern  . Not on file  Social History Narrative   Lives in [place] with girlfriend.  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   Does not routinely exercise or adhere to any particular diet.    Family History:    Family History  Problem Relation Age of Onset  . Heart attack Father        3 mi's - first in 54's.  died @ 31.  . Diabetes Father   . Hypertension Mother        alive  . Diabetes Mother   . COPD Mother   . Other Unknown        2 brothers alive & well  . Healthy Brother   . Other Brother        car accident  . Cirrhosis Brother      ROS:  Please see the history of present illness.   All other  ROS reviewed and negative.     Physical Exam/Data:   Vitals:   12/15/17 0600 12/15/17 0700 12/15/17 0800 12/15/17 0900  BP: 111/76 (!) 100/56 140/88 117/66  Pulse: 65 64 75 78  Resp: 13 17 14 12   Temp:      TempSrc:      SpO2: 98% 93% 96% 95%  Weight:      Height:        Intake/Output Summary (Last 24 hours) at 12/15/2017 1034 Last data filed at 12/14/2017 1851 Gross per 24 hour  Intake 500 ml  Output -  Net 500 ml   Filed Weights   12/14/17 1727  Weight: 235 lb (106.6 kg)   Body mass index is 32.78 kg/m.  General:  Well nourished, well developed, in no acute distress HEENT: normal Lymph: no adenopathy Neck: no JVD Endocrine:  No thryomegaly Vascular: No carotid bruits; FA pulses 2+ bilaterally without bruits  Cardiac:  normal S1, S2; RRR; no murmur  Lungs:  clear to auscultation bilaterally, no wheezing, rhonchi or rales  Abd: soft, nontender, no hepatomegaly  Ext: no edema Musculoskeletal:  No deformities, BUE and BLE strength normal and equal Skin: warm and dry  Neuro:  CNs 2-12 intact, no focal abnormalities noted Psych:  Normal affect   EKG:  The EKG was personally reviewed and demonstrates:  Sinus rhythm, 70 bpm, RBBB and LPFB, follow up this am Normal sinus rhythm with RBBB  Telemetry:  Telemetry was personally reviewed and demonstrates:  SR in the 70's  Relevant CV Studies:  LHC 03/17/2012 Impression: 1.  NSTEMI 2.  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  3.  Subtotally occluded very small early diagonal branch which may be culprit for his ongoing pain. Too small for PCI 4.  Preserved LV systolic function   Echocardiogram 03/16/12 Study Conclusions  - Left ventricle: The cavity size was mildly dilated. Wall thickness was increased in a pattern of mild LVH. Systolic function was normal. The estimated ejection fraction was in the range of 55% to 60%. Wall motion was  normal; there were no regional wall motion abnormalities. Left ventricular diastolic function parameters were normal. - Right atrium: Central venous pressure: 10mm Hg (est). - Pericardium, extracardiac: There was no pericardial effusion. Impressions: - Normal pulmonary artery pressure.  Laboratory Data:  Chemistry Recent Labs  Lab 12/14/17 1725  NA 137  K 4.6  CL 104  CO2 24  GLUCOSE 104*  BUN 15  CREATININE 0.98  CALCIUM 8.7*  GFRNONAA >60  GFRAA >60  ANIONGAP 9    No results for input(s): PROT, ALBUMIN, AST, ALT, ALKPHOS, BILITOT in the last 168 hours. Hematology Recent Labs  Lab 12/14/17 1725  WBC 12.3*  RBC 4.96  HGB 15.1  HCT 44.7  MCV 90.1  MCH 30.4  MCHC 33.8  RDW 12.8  PLT 257   Cardiac Enzymes Recent Labs  Lab 12/14/17 2008 12/15/17 0105 12/15/17 0424  TROPONINI <0.03 <0.03 <0.03    Recent Labs  Lab 12/14/17 1742  TROPIPOC 0.00    BNPNo results for input(s): BNP, PROBNP in the last 168 hours.  DDimer No results for input(s): DDIMER in the last 168 hours.  Radiology/Studies:  Dg Chest Portable 1 View  Result Date: 12/14/2017 CLINICAL DATA:  52 year old male with generalized chest pain radiating into the jaw and neck with nausea for 1 day. EXAM: PORTABLE CHEST 1 VIEW COMPARISON:  Chest CT 03/17/2012. FINDINGS: Lung volumes are normal. No consolidative airspace disease. No pleural effusions. No pneumothorax. No pulmonary nodule or mass noted. Pulmonary vasculature and the cardiomediastinal silhouette are within normal limits. IMPRESSION: No radiographic evidence of acute cardiopulmonary disease. Electronically Signed   By: Trudie Reedaniel  Entrikin M.D.   On: 12/14/2017 17:59    Assessment and Plan:   Chest  pain -Hx CAD with non-STEMI in 2013 and DES to mid LAD and balloon angioplasty to a sub-totally occluded very small diagonal branch, too small for PCI with residual minor nonobstructive disease in left circumflex and right coronary artery.  Preserved ejection fraction 55-60% (2013).  -Admitted yesterday with chest pain that occurred while on his hands and knees working, non radiating, no associated dyspnea or lightheadedness, mild nausea -CP resolved with NTG. No further chest pain but dose have burning in the throat with foul taste.  -Troponins negative X 3.  -EKG without acute ischemic changes. RBBB previously present.  -Labs unremarkable with normal range electrolytes, creatinine, Hgb. WBCs mildly elevated at 12.3 -CXR with no active disease.  -Continue aspirin and statin.  -Echo done, pending results. Will assess LV function and wall motion. -Will discuss ischemic evaluation with Dr. Antoine Poche, likely nuclear stress test.   Hyperlipidemia: Today LDL 58 which is at goal of <70. Continue simvastatin.   Tobacco abuse: Advise smoking cessation   For questions or updates, please contact [facility] Please consult [email or web address] for contact info under Cardiology/STEMI.   Signed, Berton Bon, NP  12/15/2017 10:34 AM   History and all data above reviewed.  Patient examined.  Patient with chest pain that has features atypical more than typical for angina.  No objective evidence of ischemia.  EKG without acute changes and enzymes negative.  I agree with the findings as above.  The patient exam reveals COR:RRR, no rub  ,  Lungs: clear  ,  Abd: Positive bowel sounds, no rebound no guarding, Ext No edema  .  All available labs, radiology testing, previous records reviewed. Agree with documented assessment and plan. Chest pain:  Atypical.  OK for out patient POET (Plain Old Exercise Treadmill).  Need to see results of echo.  Discharge with new prescription for NTG.  Start Toprol XL 25 mg at discharge PO once daily but hold for stress test when this is scheduled.    [name]  12:15 PM  12/15/2017

## 2017-12-15 NOTE — ED Notes (Signed)
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 Home with wife with steady gait,.

## 2017-12-15 NOTE — Discharge Summary (Signed)
Physician Discharge Summary  [name] WUJ:[id number] DOB: 1966-03-02 DOA: 12/14/2017  PCP: Joaquim Nam, MD  Admit date: 12/14/2017 Discharge date: 12/15/2017  Recommendations for Outpatient Follow-up:   Chest pain.  - stress test scheduled  Smoker - recommend cessation  Follow-up Information    [facility] Follow up.   Specialty:  Cardiology Why:  You have a cardiac exercise stress test scheduled at our [facility] office for March 1st at 3:30. Please see attached instructions for the test. Do not take Toprol (metoprolol) on the day of the test. Contact information: [address] [place] [address] [phone]           Discharge Diagnoses:  1. Chest pain.  2. CAD 3. Essential HTN 4. Smoker  Discharge Condition: improved Disposition: home  Diet recommendation: heart healthy  Filed Weights   12/14/17 1727  Weight: 106.6 kg (235 lb)    History of present illness:  51yom PMH CAD presented with chest pain. Admitted for further evaluation.   Hospital Course:  ACS ruled out, no pain, cleared by cardiology for discharge with outpatient follow-up.   Chest pain.  - resolved. Troponins negative, ACS ruled out, EKG without acute changes. 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  - echo unremarkable - cardiology recommends outpt stress test  CAD - continue ASA, statin - new Rx for NTG rec by cardiology  - Toprol XL 25 mg daily at discharge per cardiology but hold for stress test  Essential HTN - stable  Smoker - recommend cessation   Consultants:  Cardiology   Procedures:  Echo Study Conclusions  - Left ventricle: The cavity size was mildly dilated. Wall   thickness was increased in a pattern of mild LVH. Systolic   function was normal. The estimated ejection fraction was in the   range of 55% to 60%. Wall motion was normal; there were no   regional wall motion abnormalities. Features are consistent with   a  pseudonormal left ventricular filling pattern, with concomitant   abnormal relaxation and increased filling pressure (grade 2   diastolic dysfunction). - Mitral valve: Calcified annulus.  Impressions:  - Normal LV systolic function; mild LVH and LVE; moderate diastolic   dysfunction.  Discharge Instructions  Discharge Instructions    Activity as tolerated - No restrictions   Complete by:  As directed    Diet - low sodium heart healthy   Complete by:  As directed    Discharge instructions   Complete by:  As directed    Call your physician or seek immediate medical attention for chest pain, shortness of breath or worsening of condition     Allergies as of 12/15/2017      Reactions   Amoxicillin    Rash      Medication List    TAKE these medications   aspirin 81 MG tablet Take 81 mg by mouth daily.   benzonatate 100 MG capsule Commonly known as:  TESSALON Take 100 mg by mouth 3 (three) times daily.   metoprolol succinate 25 MG 24 hr tablet Commonly known as:  TOPROL XL Take 1 tablet (25 mg total) by mouth daily.   nitroGLYCERIN 0.4 MG SL tablet Commonly known as:  NITROSTAT Place 1 tablet (0.4 mg total) under the tongue every 5 (five) minutes as needed for chest pain.   predniSONE 10 MG tablet Commonly known as:  DELTASONE Take 10 mg by mouth daily.   simvastatin 20 MG tablet Commonly known as:  ZOCOR Take  1 tablet (20 mg total) by mouth at bedtime.      Allergies  Allergen Reactions  . Amoxicillin     Rash     The results of significant diagnostics from this hospitalization (including imaging, microbiology, ancillary and laboratory) are listed below for reference.    Significant Diagnostic Studies: Dg Chest Portable 1 View  Result Date: 12/14/2017 CLINICAL DATA:  52 year old male with generalized chest pain radiating into the jaw and neck with nausea for 1 day. EXAM: PORTABLE CHEST 1 VIEW COMPARISON:  Chest CT 03/17/2012. FINDINGS: Lung volumes are  normal. No consolidative airspace disease. No pleural effusions. No pneumothorax. No pulmonary nodule or mass noted. Pulmonary vasculature and the cardiomediastinal silhouette are within normal limits. IMPRESSION: No radiographic evidence of acute cardiopulmonary disease. Electronically Signed   By: Trudie Reedaniel  Entrikin M.D.   On: 12/14/2017 17:59    Labs: Basic Metabolic Panel: Recent Labs  Lab 12/14/17 1725  NA 137  K 4.6  CL 104  CO2 24  GLUCOSE 104*  BUN 15  CREATININE 0.98  CALCIUM 8.7*   CBC: Recent Labs  Lab 12/14/17 1725  WBC 12.3*  HGB 15.1  HCT 44.7  MCV 90.1  PLT 257   Cardiac Enzymes: Recent Labs  Lab 12/14/17 2008 12/15/17 0105 12/15/17 0424  TROPONINI <0.03 <0.03 <0.03    Active Problems:   CAD (coronary artery disease)   Chest pain   Tobacco abuse   Hyperlipidemia   Time coordinating discharge: 35 minutes  Signed:  Brendia Sacksaniel Reanne Nellums, MD [facility] 12/15/2017, 3:15 PM

## 2017-12-15 NOTE — ED Notes (Signed)
Phone taken to patient with MD on line

## 2017-12-21 ENCOUNTER — Telehealth (HOSPITAL_COMMUNITY): Payer: Self-pay

## 2017-12-21 NOTE — Telephone Encounter (Signed)
Encounter complete. 

## 2017-12-23 ENCOUNTER — Ambulatory Visit (HOSPITAL_COMMUNITY)
Admit: 2017-12-23 | Discharge: 2017-12-23 | Disposition: A | Discharge status: Home / Self Care | Payer: Self-pay | Attending: Cardiology | Admitting: Cardiology

## 2017-12-23 DIAGNOSIS — R0789 Other chest pain: Secondary | ICD-10-CM | POA: Insufficient documentation

## 2017-12-23 LAB — EXERCISE TOLERANCE TEST
CHL CUP MPHR: 169 {beats}/min
CSEPEDS: 16 s
CSEPHR: 90 %
Estimated workload: 10.5 METS
Exercise duration (min): 9 min
Peak HR: 153 {beats}/min
RPE: 18
Rest HR: 74 {beats}/min

## 2018-03-06 ENCOUNTER — Encounter: Payer: Self-pay | Admitting: Cardiovascular Disease

## 2018-03-06 ENCOUNTER — Ambulatory Visit (INDEPENDENT_AMBULATORY_CARE_PROVIDER_SITE_OTHER): Payer: Self-pay | Admitting: Cardiovascular Disease

## 2018-03-06 ENCOUNTER — Encounter (INDEPENDENT_AMBULATORY_CARE_PROVIDER_SITE_OTHER): Payer: Self-pay

## 2018-03-06 VITALS — BP 104/68 | HR 96 | Ht 71.0 in | Wt 242.0 lb

## 2018-03-06 DIAGNOSIS — E782 Mixed hyperlipidemia: Secondary | ICD-10-CM

## 2018-03-06 DIAGNOSIS — I2510 Atherosclerotic heart disease of native coronary artery without angina pectoris: Secondary | ICD-10-CM

## 2018-03-06 MED ORDER — SILDENAFIL CITRATE 20 MG PO TABS: ORAL_TABLET | ORAL | 1 refills | Status: DC

## 2018-03-06 NOTE — Patient Instructions (Signed)
Medication Instructions:  1) you have been given a prescription for Sildenafil 20 mg. You may take 2-5 tablets (40-100 mg) once daily as needed prior to sexual activity.  Labwork: None  Testing/Procedures: None  Follow-Up: Your provider wants you to follow-up in: 1 year with Dr. Excell Seltzer. You will receive a reminder letter in the mail two months in advance. If you don't receive a letter, please call our office to schedule the follow-up appointment.    Any Other Special Instructions Will Be Listed Below (If Applicable).     If you need a refill on your cardiac medications before your next appointment, please call your pharmacy.

## 2018-03-06 NOTE — Progress Notes (Signed)
Cardiology Office Note Date:  03/06/2018   ID:  [name], DOB 11-Feb-1966, MRN [id number]  PCP:  Joaquim Nam, MD  Cardiologist:  Tonny Bollman, MD    No chief complaint on file.    History of Present Illness: [name] is a 52 y.o. male who presents for  follow-up of coronary artery disease. He presented in 2013 with a non-ST elevation infarction. He was found to have severe stenosis of the LAD/diagonal bifurcation and was treated with a drug-eluting stent in his LAD and angioplasty of the diagonal. There was minor nonobstructive disease in the left circumflex and right coronary artery and the patient's left ventricular function was normal with an ejection fraction of 55%.  The patient is here alone today.  He has been doing well.  He had an episode of chest pain earlier this year prompting emergency room evaluation.  He ruled out for MI with negative troponins.  He had a follow-up exercise treadmill study that was normal with good exercise tolerance.  He said no recurrent chest pain or pressure, shortness of breath, edema, or heart palpitations.  He is still smoking but feels committed to quitting.  Past Medical History:  Diagnosis Date  . CAD (coronary artery disease) 03-17-2012   S/P NSTEMI with PCI balloon angioplasty diagonal and DES x 1 mid LAD; normal LV function  . Hypertension   . Low back pain   . Obesity   . Tobacco abuse    a. 1 1/2 ppd x 20 yrs (02/2012)    Past Surgical History:  Procedure Laterality Date  . APPENDECTOMY    . CORONARY STENT PLACEMENT  02/2012   balloon PCI to DX; DES to mid LAD  . HERNIA REPAIR Right 2016  . INTRAVASCULAR ULTRASOUND  03/17/2012   Procedure: INTRAVASCULAR ULTRASOUND;  Surgeon: Kathleene Hazel, MD;  Location: [facility] CATH LAB;  Service: Cardiovascular;;  . LEFT HEART CATHETERIZATION WITH CORONARY ANGIOGRAM N/A 03/17/2012   Procedure: LEFT HEART CATHETERIZATION WITH CORONARY ANGIOGRAM;  Surgeon: Kathleene Hazel, MD;  Location: [facility] CATH LAB;  Service: Cardiovascular;  Laterality: N/A;    Current Outpatient Medications  Medication Sig Dispense Refill  . aspirin 81 MG tablet Take 81 mg by mouth daily.    Marland Kitchen doxycycline (MONODOX) 100 MG capsule Take 1 capsule by mouth 2 (two) times daily.    . nitroGLYCERIN (NITROSTAT) 0.4 MG SL tablet Place 1 tablet (0.4 mg total) under the tongue every 5 (five) minutes as needed for chest pain. 30 tablet 0  . simvastatin (ZOCOR) 20 MG tablet Take 1 tablet (20 mg total) by mouth at bedtime. 90 tablet 3   No current facility-administered medications for this visit.     Allergies:   Amoxicillin   Social History:  The patient  reports that he has been smoking cigarettes.  He has a 20.00 pack-year smoking history. He has never used smokeless tobacco. He reports that he drinks about 4.8 oz of alcohol per week. He reports that he does not use drugs.   Family History:  The patient's family history includes COPD in his mother; Cirrhosis in his brother; Diabetes in his father and mother; Healthy in his brother; Heart attack in his father; Hypertension in his mother; Other in his brother and unknown relative.    ROS:  Please see the history of present illness.  All other systems are reviewed and negative.    PHYSICAL EXAM: VS:  BP 104/68   Pulse 96  Ht  (1.803 m)   Wt 242 lb (109.8 kg)   SpO2 97%   BMI 33.75 kg/m  , BMI Body mass index is 33.75 kg/m. GEN: Well nourished, well developed, in no acute distress  HEENT: normal  Neck: no JVD, no masses. No carotid bruits Cardiac: RRR without murmur or gallop                Respiratory:  clear to auscultation bilaterally, normal work of breathing GI: soft, nontender, nondistended, + BS MS: no deformity or atrophy  Ext: no pretibial edema, pedal pulses 2+= bilaterally Skin: warm and dry, no rash Neuro:  Strength and sensation are intact Psych: euthymic mood, full affect  EKG:  EKG is not ordered  today.  Recent Labs: 04/06/2017: ALT 18 12/14/2017: BUN 15; Creatinine, Ser 0.98; Hemoglobin 15.1; Platelets 257; Potassium 4.6; Sodium 137 12/15/2017: TSH 0.297   Lipid Panel     Component Value Date/Time   CHOL 118 12/15/2017 0420   CHOL 150 04/06/2017 1041   TRIG 145 12/15/2017 0420   HDL 31 (L) 12/15/2017 0420   HDL 37 (L) 04/06/2017 1041   CHOLHDL 3.8 12/15/2017 0420   VLDL 29 12/15/2017 0420   LDLCALC 58 12/15/2017 0420   LDLCALC 74 04/06/2017 1041   LDLDIRECT 81.5 10/29/2013 0904      Wt Readings from Last 3 Encounters:  03/06/18 242 lb (109.8 kg)  12/14/17 235 lb (106.6 kg)  04/06/17 234 lb (106.1 kg)     Cardiac Studies Reviewed: GXT 12/23/2017: Study Highlights     Blood pressure demonstrated a normal response to exercise.  There was no ST segment deviation noted during stress.   ETT with good exercise tolerance (9:16); no chest pain; normal BP response; no ST changes; negative adequate ETT; duke treadmill score 9.   Stress Findings   ECG NSR, LAFB, RBBB. .  Stress Findings The patient exercised following the Bruce protocol.  The patient reported shortness of breath during the stress test. The patient experienced no angina during the stress test.   The test was stopped because the patient complained of fatigue.   Blood pressure and heart rate demonstrated a normal response to exercise. Blood pressure demonstrated a normal response to exercise. Overall, the patient's exercise capacity was normal.   85% of maximum heart rate was achieved after 8.5 minutes. Recovery time: 9 minutes. The patient's response to exercise was adequate for diagnosis.  Response to Stress There was no ST segment deviation noted during stress.  Arrhythmias during stress: none.  Arrhythmias during recovery: none.  There were no significant arrhythmias noted during the test.  ECG was interpretable and there was no significant change from baseline.  Stress Measurements   Baseline  Vitals  Rest HR 74 bpm    Rest BP 128/78 mmHg    Exercise Time  Exercise duration (min) 9 min    Exercise duration (sec) 16 sec    Peak Stress Vitals  Peak HR 153 bpm    Peak BP 146/67 mmHg    Exercise Data  MPHR 169 bpm    Percent HR 90 %    RPE 18     Estimated workload 10.5 METS       Echo 12/15/2017: Left ventricle:  The cavity size was mildly dilated. Wall thickness was increased in a pattern of mild LVH. Systolic function was normal. The estimated ejection fraction was in the range of 55% to 60%. Wall motion was normal; there were no regional wall  motion abnormalities. Features are consistent with a pseudonormal left ventricular filling pattern, with concomitant abnormal relaxation and increased filling pressure (grade 2 diastolic dysfunction).  ------------------------------------------------------------------- Aortic valve:   Trileaflet; mildly thickened leaflets. Mobility was not restricted.  Doppler:  Transvalvular velocity was within the normal range. There was no stenosis. There was no regurgitation.   ------------------------------------------------------------------- Aorta:  Aortic root: The aortic root was normal in size.  ------------------------------------------------------------------- Mitral valve:   Calcified annulus. Mobility was not restricted. Doppler:  Transvalvular velocity was within the normal range. There was no evidence for stenosis. There was no regurgitation.    Peak gradient (D): 2 mm Hg.  ------------------------------------------------------------------- Left atrium:  The atrium was normal in size.  ------------------------------------------------------------------- Right ventricle:  The cavity size was normal. Systolic function was normal.  ------------------------------------------------------------------- Pulmonic valve:    Doppler:  Transvalvular velocity was within the normal range. There was no evidence for  stenosis.  ------------------------------------------------------------------- Tricuspid valve:   Structurally normal valve.    Doppler: Transvalvular velocity was within the normal range. There was trivial regurgitation.  ------------------------------------------------------------------- Pulmonary artery:   Systolic pressure was within the normal range.   ------------------------------------------------------------------- Right atrium:  The atrium was normal in size.  ------------------------------------------------------------------- Pericardium:  There was no pericardial effusion.  ------------------------------------------------------------------- Systemic veins: Inferior vena cava: The vessel was normal in size.  ASSESSMENT AND PLAN: 1.  CAD, native vessel, without angina: Exercise treadmill results reviewed.  Continued clinical follow-up is indicated.  He should stay on aspirin for antiplatelet therapy and a statin drug for lipid lowering.  He has discontinued his beta-blocker and there does not seem to be an ongoing indication for this.  2. Hyperlipidemia: Treated with simvastatin.  Most recent lipids reviewed demonstrate an LDL cholesterol 58 mg/dL.  3. Tobacco abuse: Cessation counseling done.  He plans to quit cold [place].  He understands the implications for his health.   Current medicines are reviewed with the patient today.  The patient does not have concerns regarding medicines.  Labs/ tests ordered today include:  No orders of the defined types were placed in this encounter.   Disposition:   FU one year  Signed, Tonny Bollman, MD  03/06/2018 4:31 PM    [facility] [address] [place], [place], [place]  [address] Phone: [phone]; Fax: [phone]

## 2018-12-26 IMAGING — DX DG CHEST 1V PORT
1 series · 1 of 1 positions shown · non-contrast
Comparison: Chest CT 03/17/2012.

CLINICAL DATA: 51-year-old male with generalized chest pain
radiating into the jaw and neck with nausea for 1 day.

EXAM:
PORTABLE CHEST 1 VIEW

[chest]
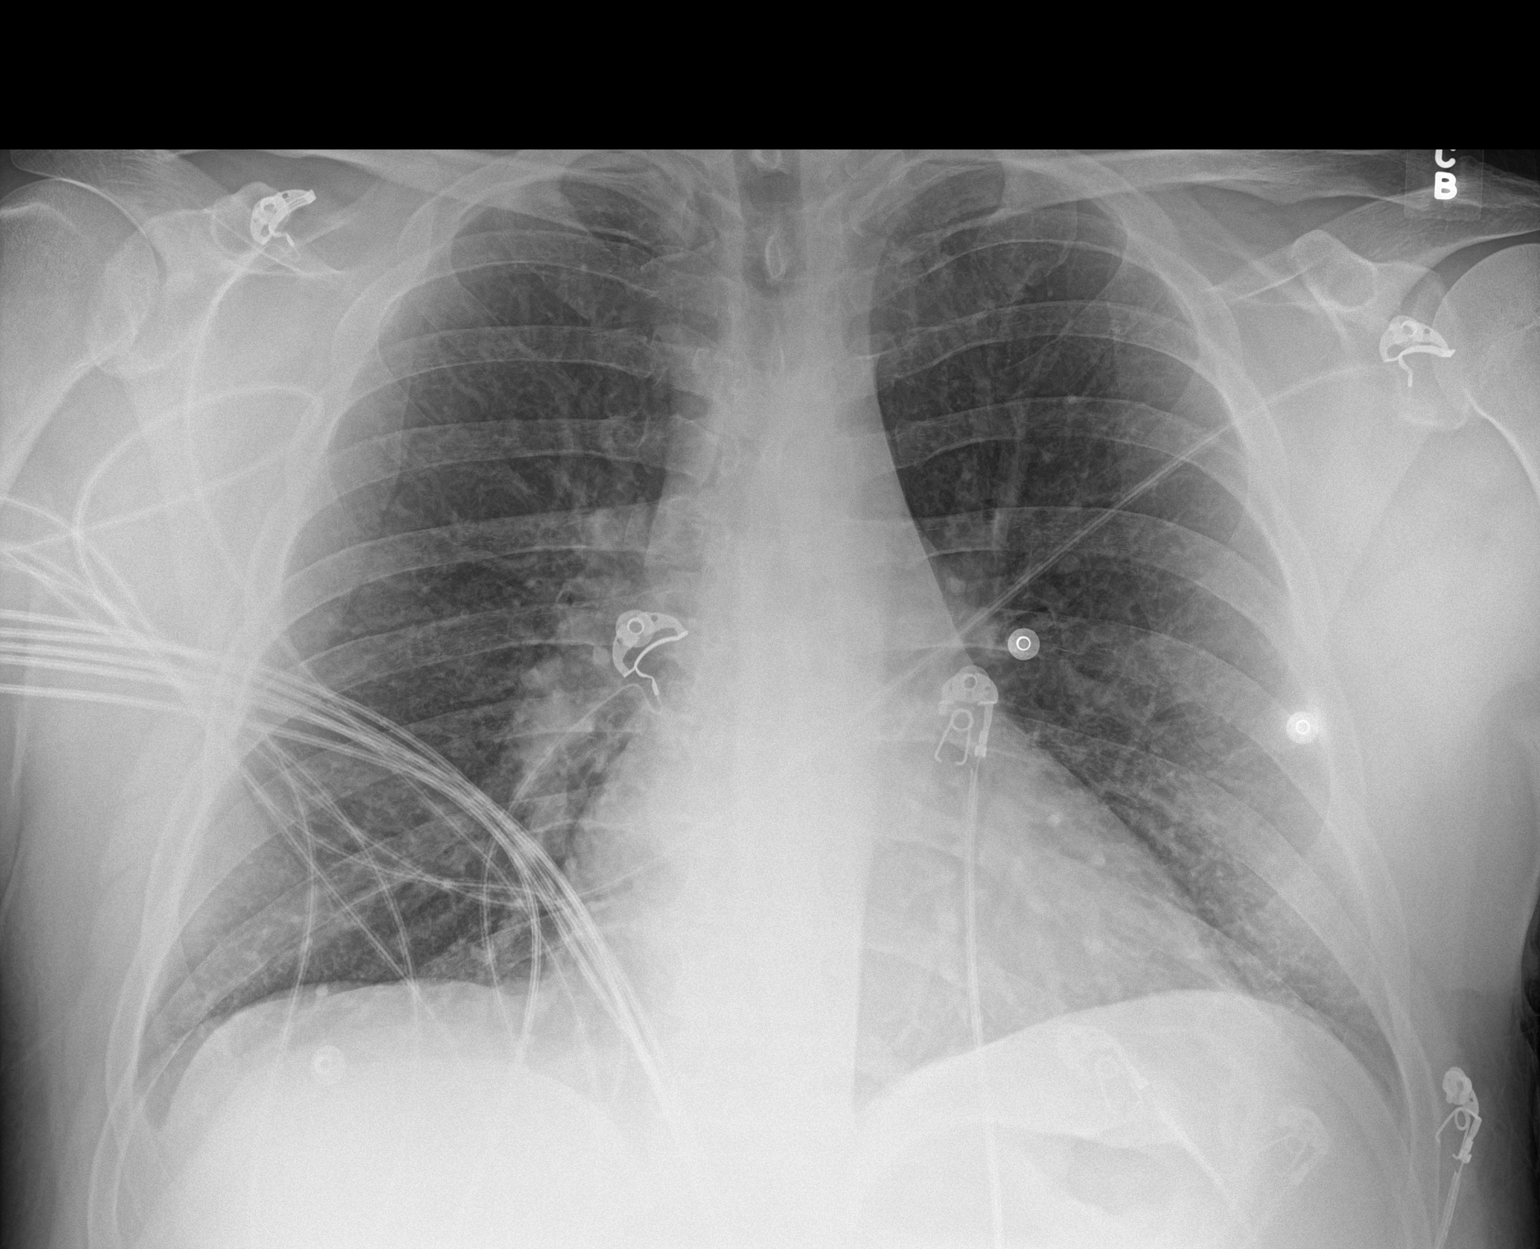

[1 of 1 positions shown; findings below may reference images not displayed]

FINDINGS: Lung volumes are normal. No consolidative airspace disease. No
pleural effusions. No pneumothorax. No pulmonary nodule or mass
noted. Pulmonary vasculature and the cardiomediastinal silhouette
are within normal limits.
IMPRESSION: No radiographic evidence of acute cardiopulmonary disease.

## 2019-01-16 ENCOUNTER — Other Ambulatory Visit: Payer: Self-pay | Admitting: *Deleted

## 2019-01-16 DIAGNOSIS — I2510 Atherosclerotic heart disease of native coronary artery without angina pectoris: Secondary | ICD-10-CM

## 2019-01-16 DIAGNOSIS — Z833 Family history of diabetes mellitus: Secondary | ICD-10-CM

## 2019-01-16 DIAGNOSIS — E785 Hyperlipidemia, unspecified: Secondary | ICD-10-CM

## 2019-01-16 MED ORDER — SIMVASTATIN 20 MG PO TABS: 20.0000 mg | ORAL_TABLET | Freq: Every day | ORAL | 0 refills | Status: DC

## 2019-06-29 ENCOUNTER — Other Ambulatory Visit: Payer: Self-pay | Admitting: Cardiovascular Disease

## 2019-06-29 DIAGNOSIS — E785 Hyperlipidemia, unspecified: Secondary | ICD-10-CM

## 2019-06-29 DIAGNOSIS — Z833 Family history of diabetes mellitus: Secondary | ICD-10-CM

## 2019-06-29 DIAGNOSIS — I2510 Atherosclerotic heart disease of native coronary artery without angina pectoris: Secondary | ICD-10-CM

## 2019-12-27 ENCOUNTER — Other Ambulatory Visit: Payer: Self-pay | Admitting: Cardiovascular Disease

## 2019-12-27 DIAGNOSIS — E785 Hyperlipidemia, unspecified: Secondary | ICD-10-CM

## 2019-12-27 DIAGNOSIS — I2510 Atherosclerotic heart disease of native coronary artery without angina pectoris: Secondary | ICD-10-CM

## 2019-12-27 DIAGNOSIS — Z833 Family history of diabetes mellitus: Secondary | ICD-10-CM

## 2020-03-07 ENCOUNTER — Other Ambulatory Visit: Payer: Self-pay | Admitting: Cardiovascular Disease

## 2020-03-07 DIAGNOSIS — E785 Hyperlipidemia, unspecified: Secondary | ICD-10-CM

## 2020-03-07 DIAGNOSIS — I2510 Atherosclerotic heart disease of native coronary artery without angina pectoris: Secondary | ICD-10-CM

## 2020-03-07 DIAGNOSIS — Z833 Family history of diabetes mellitus: Secondary | ICD-10-CM

## 2020-04-09 ENCOUNTER — Other Ambulatory Visit: Payer: Self-pay | Admitting: Cardiovascular Disease

## 2020-04-09 DIAGNOSIS — Z833 Family history of diabetes mellitus: Secondary | ICD-10-CM

## 2020-04-09 DIAGNOSIS — E785 Hyperlipidemia, unspecified: Secondary | ICD-10-CM

## 2020-04-09 DIAGNOSIS — I2510 Atherosclerotic heart disease of native coronary artery without angina pectoris: Secondary | ICD-10-CM

## 2020-05-02 ENCOUNTER — Telehealth: Payer: Self-pay | Admitting: Cardiovascular Disease

## 2020-05-02 DIAGNOSIS — Z833 Family history of diabetes mellitus: Secondary | ICD-10-CM

## 2020-05-02 DIAGNOSIS — I2510 Atherosclerotic heart disease of native coronary artery without angina pectoris: Secondary | ICD-10-CM

## 2020-05-02 DIAGNOSIS — E785 Hyperlipidemia, unspecified: Secondary | ICD-10-CM

## 2020-05-02 MED ORDER — SIMVASTATIN 20 MG PO TABS: ORAL_TABLET | ORAL | 1 refills | Status: DC

## 2020-05-02 NOTE — Telephone Encounter (Signed)
*  STAT* If patient is at the pharmacy, call can be transferred to refill team.   1. Which medications need to be refilled? (please list name of each medication and dose if known)  simvastatin (ZOCOR) 20 MG tablet  2. Which pharmacy/location (including street and city if local pharmacy) is medication to be sent to? [facility] [address], [place] - [address]  3. Do they need a 30 day or 90 day supply? 90  Pt could not be in to see Tereso Newcomer until 06/17/20  Pt is out of medication.

## 2020-05-02 NOTE — Telephone Encounter (Signed)
Pt's medication was sent to pt's pharmacy as requested. Confirmation received.  °

## 2020-06-17 ENCOUNTER — Ambulatory Visit: Payer: Self-pay | Admitting: Physician Assistant

## 2020-06-17 NOTE — Progress Notes (Deleted)
Cardiology Office Note:    Date:  06/17/2020   ID:  Ireland Chagnon, DOB 19-Sep-1966, MRN [id number]  PCP:  Tonia Ghent, MD  Cardiologist:  Sherren Mocha, MD *** Electrophysiologist:  None   Referring MD: Tonia Ghent, MD   Chief Complaint:  No chief complaint on file.    Patient Profile:    [name] is a 54 y.o. male with:   Coronary artery disease   S/p NSTEMI in 2013 >> PCI: DES to LAD and POBA to Dx  Echocardiogram 2/19: EF 55-60, Gr 2 DD  ETT 3/19: Low Risk  Hypertension   Hyperlipidemia   Tobacco use   Prior CV studies: ETT 12/23/17 Ex 9'16"; no ST changes; negative adequate ETT; duke treadmill score 9  Echocardiogram 12/15/2017 Mild LVH, EF 55-60, normal wall motion, GR 2 DD, MAC  Cardiac catheterization 03/17/2012 LAD mid 70-80; D1 very small with subtotal occlusion (too small for PCI); D2 proximal 80 RCA mid 30 EF 55 PCI: 3 x 20 mm Promus DES to the LAD; POBA to the D2  History of Present Illness:    Mr. Coggeshall was last seen by Dr. Burt Knack in 5/19.  He returns for follow up.  The DICTATELATER SmartLink is not supported in this context. ***   Past Medical History:  Diagnosis Date  . CAD (coronary artery disease) 03-17-2012   S/P NSTEMI with PCI balloon angioplasty diagonal and DES x 1 mid LAD; normal LV function  . Hypertension   . Low back pain   . Obesity   . Tobacco abuse    a. 1 1/2 ppd x 20 yrs (02/2012)    Current Medications: No outpatient medications have been marked as taking for the 06/17/20 encounter (Appointment) with Richardson Dopp T, PA-C.     Allergies:   Amoxicillin   Social History   Tobacco Use  . Smoking status: Current Every Day Smoker    Packs/day: 1.00    Years: 20.00    Pack years: 20.00    Types: Cigarettes  . Smokeless tobacco: Never Used  Substance Use Topics  . Alcohol use: Yes    Alcohol/week: 8.0 standard drinks    Types: 8 Cans of beer per week    Comment: 6 pack per week  . Drug use: No       Family Hx: The patient's family history includes COPD in his mother; Cirrhosis in his brother; Diabetes in his father and mother; Healthy in his brother; Heart attack in his father; Hypertension in his mother; Other in his brother and unknown relative.  ROS   EKGs/Labs/Other Test Reviewed:    EKG:  EKG is *** ordered today.  The ekg ordered today demonstrates ***  Recent Labs: No results found for requested labs within last 8760 hours.   Recent Lipid Panel Lab Results  Component Value Date/Time   CHOL 118 12/15/2017 04:20 AM   CHOL 150 04/06/2017 10:41 AM   TRIG 145 12/15/2017 04:20 AM   HDL 31 (L) 12/15/2017 04:20 AM   HDL 37 (L) 04/06/2017 10:41 AM   CHOLHDL 3.8 12/15/2017 04:20 AM   LDLCALC 58 12/15/2017 04:20 AM   LDLCALC 74 04/06/2017 10:41 AM   LDLDIRECT 81.5 10/29/2013 09:04 AM    Physical Exam:    VS:  There were no vitals taken for this visit.    Wt Readings from Last 3 Encounters:  03/06/18 242 lb (109.8 kg)  12/14/17 235 lb (106.6 kg)  04/06/17 234 lb (106.1  kg)     Physical Exam ***  ASSESSMENT & PLAN:    ***  Dispo:  No follow-ups on file.   Medication Adjustments/Labs and Tests Ordered: Current medicines are reviewed at length with the patient today.  Concerns regarding medicines are outlined above.  Tests Ordered: No orders of the defined types were placed in this encounter.  Medication Changes: No orders of the defined types were placed in this encounter.   Signed, Richardson Dopp, PA-C  06/17/2020 8:31 AM    [facility] [place], [place], [place]  [address] Phone: [phone]; Fax: [phone]

## 2020-07-29 ENCOUNTER — Other Ambulatory Visit: Payer: Self-pay

## 2020-07-29 ENCOUNTER — Ambulatory Visit (INDEPENDENT_AMBULATORY_CARE_PROVIDER_SITE_OTHER): Payer: Self-pay | Admitting: Physician Assistant

## 2020-07-29 ENCOUNTER — Encounter: Payer: Self-pay | Admitting: Physician Assistant

## 2020-07-29 VITALS — BP 130/70 | HR 94 | Ht 71.0 in | Wt 262.0 lb

## 2020-07-29 DIAGNOSIS — E669 Obesity, unspecified: Secondary | ICD-10-CM

## 2020-07-29 DIAGNOSIS — Z720 Tobacco use: Secondary | ICD-10-CM

## 2020-07-29 DIAGNOSIS — I2510 Atherosclerotic heart disease of native coronary artery without angina pectoris: Secondary | ICD-10-CM

## 2020-07-29 DIAGNOSIS — E782 Mixed hyperlipidemia: Secondary | ICD-10-CM

## 2020-07-29 MED ORDER — NITROGLYCERIN 0.4 MG SL SUBL: 0.4000 mg | SUBLINGUAL_TABLET | SUBLINGUAL | 3 refills | Status: DC | PRN

## 2020-07-29 NOTE — Progress Notes (Signed)
Cardiology Office Note:    Date:  07/29/2020   ID:  [name], DOB 1966/07/26, MRN [id number]  PCP:  Tonia Ghent, MD  [name] Cardiologist:  Sherren Mocha, MD   [name] Electrophysiologist:  None   Referring MD: Tonia Ghent, MD   Chief Complaint:  Follow-up (CAD)    Patient Profile:    [name] is a 54 y.o. male with:   Coronary artery disease   S/p NSTEMI in 2013 >>PCI: DES to LAD and POBA to D2   Preserved LVF  Hyperlipidemia   Tobacco abuse   Prior CV studies: ETT 12/23/17 Ex 9'16", neg adequate ETT, Duke Treadmill Score 9  Echocardiogram 12/15/17 Mild LVH, EF 55-60, no RWMA, Gr 2 DD, MAC  Cardiac catheterization 03/17/12 LAD mid 70-80; D1 sub total occlusion, D2 prox 80 RCA diffuse mid 30 EF 55 PCI:  3 x 28 mm Promus Element DES to mLAD and POBA to D2 D1 too small for PCI  History of Present Illness:    Mr. Kamau was last seen by Dr. Burt Knack in 5/19.  He returns for follow up. He is here alone. Overall, he has been doing well. He has not had chest discomfort, significant shortness of breath, orthopnea, leg swelling or syncope. He remains pretty active at work. He is a Brewing technologist. He has started back smoking.      Past Medical History:  Diagnosis Date  . CAD (coronary artery disease) 03/17/2012   S/P NSTEMI in 5/13 >> Tx with PCI balloon angioplasty diagonal and DES x 1 mid LAD; normal LV function // ETT low risk in 3/19  // Echo 2/19: EF 55-60, Gr 2 DD  . HLD (hyperlipidemia)   . Low back pain   . Obesity   . Tobacco abuse    a. 1 1/2 ppd x 20 yrs (02/2012)    Current Medications: Current Meds  Medication Sig  . aspirin 81 MG tablet Take 81 mg by mouth daily.  Marland Kitchen doxycycline (MONODOX) 100 MG capsule Take 1 capsule by mouth 2 (two) times daily.  . nitroGLYCERIN (NITROSTAT) 0.4 MG SL tablet Place 1 tablet (0.4 mg total) under the tongue every 5 (five) minutes as needed for chest pain.  . sildenafil (REVATIO) 20  MG tablet Take 2-5 tablets (40-100 mg) once daily as needed prior to sexual activity.  . simvastatin (ZOCOR) 20 MG tablet TAKE 1 TABLET BY MOUTH ONCE DAILY AT  6  PM. Please keep upcoming in July with Dr. Burt Knack before anymore refills. Final Attempt  . [DISCONTINUED] nitroGLYCERIN (NITROSTAT) 0.4 MG SL tablet Place 1 tablet (0.4 mg total) under the tongue every 5 (five) minutes as needed for chest pain.     Allergies:   Amoxicillin   Social History   Tobacco Use  . Smoking status: Current Every Day Smoker    Packs/day: 1.00    Years: 20.00    Pack years: 20.00    Types: Cigarettes  . Smokeless tobacco: Never Used  Substance Use Topics  . Alcohol use: Yes    Alcohol/week: 8.0 standard drinks    Types: 8 Cans of beer per week    Comment: 6 pack per week  . Drug use: No     Family Hx: The patient's family history includes COPD in his mother; Cirrhosis in his brother; Diabetes in his father and mother; Healthy in his brother; Heart attack in his father; Hypertension in his mother; Other in his brother and unknown  relative.  Review of Systems  Cardiovascular: Negative for claudication.     EKGs/Labs/Other Test Reviewed:    EKG:  EKG is   ordered today.  The ekg ordered today demonstrates NSR, HR 96, rightward axis, right bundle branch block, nonspecific ST-T wave changes, QTC 497, similar to prior tracings  Recent Labs: No results found for requested labs within last 8760 hours.   Recent Lipid Panel Lab Results  Component Value Date/Time   CHOL 118 12/15/2017 04:20 AM   CHOL 150 04/06/2017 10:41 AM   TRIG 145 12/15/2017 04:20 AM   HDL 31 (L) 12/15/2017 04:20 AM   HDL 37 (L) 04/06/2017 10:41 AM   CHOLHDL 3.8 12/15/2017 04:20 AM   LDLCALC 58 12/15/2017 04:20 AM   LDLCALC 74 04/06/2017 10:41 AM   LDLDIRECT 81.5 10/29/2013 09:04 AM    Physical Exam:    VS:  BP 130/70   Pulse 94   Ht _0  (1.803 m)   Wt 262 lb (118.8 kg)   SpO2 98%   BMI 36.54 kg/m     Wt  Readings from Last 3 Encounters:  07/29/20 262 lb (118.8 kg)  03/06/18 242 lb (109.8 kg)  12/14/17 235 lb (106.6 kg)     Constitutional:      Appearance: Healthy appearance. Not in distress.  Neck:     Vascular: No carotid bruit. JVD normal.  Pulmonary:     Effort: Pulmonary effort is normal.     Breath sounds: No wheezing. No rales.  Cardiovascular:     Normal rate. Regular rhythm. Normal S1. Normal S2.     Murmurs: There is no murmur.  Edema:    Peripheral edema absent.  Abdominal:     Palpations: Abdomen is soft. There is no hepatomegaly.  Skin:    General: Skin is warm and dry.  Neurological:     General: No focal deficit present.     Mental Status: Alert and oriented to person, place and time.     Cranial Nerves: Cranial nerves are intact.      ASSESSMENT & Goodwin:    1. Coronary artery disease involving native coronary artery of native heart without angina pectoris History of non-STEMI in 2013 treated with a DES to the LAD and balloon angioplasty to the D2. He had a low risk exercise test in March 2019. He is doing well without chest discomfort or significant shortness of breath. We discussed the importance of routine exercise, healthy diet and weight loss. We also discussed the importance of tobacco cessation. Continue current dose of aspirin, simvastatin. His blood pressure is somewhat elevated today. I have asked him to monitor his blood pressure and let us know if it runs >130/80.  2. Mixed hyperlipidemia Continue simvastatin. Arrange fasting CMET, lipids.  3. Tobacco abuse We discussed the importance of tobacco cessation and briefly discussed a Goodwin for quitting.  4. Obesity (BMI 30-39.9) We discussed the importance of regular exercise and healthy diet as well as weight loss.    Dispo:  Return in about 1 year (around 07/29/2021) for Routine Follow Up, w/ Dr. Burt Knack, or Richardson Dopp, PA-C, in person.   Medication Adjustments/Labs and Tests Ordered: Current  medicines are reviewed at length with the patient today.  Concerns regarding medicines are outlined above.  Tests Ordered: Orders Placed This Encounter  Procedures  . Comprehensive metabolic panel  . Lipid panel  . EKG 12-Lead   Medication Changes: Meds ordered this encounter  Medications  . nitroGLYCERIN (NITROSTAT) 0.4  MG SL tablet    Sig: Place 1 tablet (0.4 mg total) under the tongue every 5 (five) minutes as needed for chest pain.    Dispense:  25 tablet    Refill:  3    Signed, Richardson Dopp, PA-C  07/29/2020 4:03 PM    [facility] [place], [place], [place]  [address] Phone: [phone]; Fax: [phone]

## 2020-07-29 NOTE — Patient Instructions (Signed)
Medication Instructions:  Your physician recommends that you continue on your current medications as directed. Please refer to the Current Medication list given to you today.  *If you need a refill on your cardiac medications before your next appointment, please call your pharmacy*  Lab Work: Your physician recommends that you return for lab work in 2 weeks on 08/12/20 **The lab is open from 7:30AM-4:30PM** You may come anytime between those hours, make sure you are fasting so we can check your cholesterol   Testing/Procedures: None ordered today  Follow-Up: At [facility], you and your health needs are our priority.  As part of our continuing mission to provide you with exceptional heart care, we have created designated Provider Care Teams.  These Care Teams include your primary Cardiologist (physician) and Advanced Practice Providers (APPs -  Physician Assistants and Nurse Practitioners) who all work together to provide you with the care you need, when you need it.  Your next appointment:   12 month(s)  The format for your next appointment:   In Person  Provider:   You may see Tonny Bollman, MD or Tereso Newcomer, PA-C

## 2020-08-12 ENCOUNTER — Other Ambulatory Visit: Payer: Self-pay

## 2020-08-12 ENCOUNTER — Other Ambulatory Visit: Payer: Self-pay | Admitting: *Deleted

## 2020-08-12 DIAGNOSIS — E782 Mixed hyperlipidemia: Secondary | ICD-10-CM

## 2020-08-12 DIAGNOSIS — I2510 Atherosclerotic heart disease of native coronary artery without angina pectoris: Secondary | ICD-10-CM

## 2020-08-12 LAB — COMPREHENSIVE METABOLIC PANEL
ALT: 28 IU/L (ref 0–44)
AST: 22 IU/L (ref 0–40)
Albumin/Globulin Ratio: 2.9 — ABNORMAL HIGH (ref 1.2–2.2)
Albumin: 4.6 g/dL (ref 3.8–4.9)
Alkaline Phosphatase: 88 IU/L (ref 44–121)
BUN/Creatinine Ratio: 19 (ref 9–20)
BUN: 17 mg/dL (ref 6–24)
Bilirubin Total: 0.6 mg/dL (ref 0.0–1.2)
CO2: 24 mmol/L (ref 20–29)
Calcium: 9.5 mg/dL (ref 8.7–10.2)
Chloride: 101 mmol/L (ref 96–106)
Creatinine, Ser: 0.91 mg/dL (ref 0.76–1.27)
GFR calc Af Amer: 111 mL/min/{1.73_m2} (ref >59)
GFR calc non Af Amer: 96 mL/min/{1.73_m2} (ref >59)
Globulin, Total: 1.6 g/dL (ref 1.5–4.5)
Glucose: 95 mg/dL (ref 65–99)
Potassium: 4.7 mmol/L (ref 3.5–5.2)
Sodium: 139 mmol/L (ref 134–144)
Total Protein: 6.2 g/dL (ref 6.0–8.5)

## 2020-08-12 LAB — LIPID PANEL
Chol/HDL Ratio: 5.5 ratio — ABNORMAL HIGH (ref 0.0–5.0)
Cholesterol, Total: 202 mg/dL — ABNORMAL HIGH (ref 100–199)
HDL: 37 mg/dL — ABNORMAL LOW (ref >39)
LDL Chol Calc (NIH): 106 mg/dL — ABNORMAL HIGH (ref 0–99)
Triglycerides: 346 mg/dL — ABNORMAL HIGH (ref 0–149)
VLDL Cholesterol Cal: 59 mg/dL — ABNORMAL HIGH (ref 5–40)

## 2020-08-18 ENCOUNTER — Other Ambulatory Visit: Payer: Self-pay | Admitting: Cardiovascular Disease

## 2020-08-18 DIAGNOSIS — Z833 Family history of diabetes mellitus: Secondary | ICD-10-CM

## 2020-08-18 DIAGNOSIS — I2510 Atherosclerotic heart disease of native coronary artery without angina pectoris: Secondary | ICD-10-CM

## 2020-08-18 DIAGNOSIS — E785 Hyperlipidemia, unspecified: Secondary | ICD-10-CM

## 2020-08-19 ENCOUNTER — Other Ambulatory Visit: Payer: Self-pay

## 2020-08-19 DIAGNOSIS — I2510 Atherosclerotic heart disease of native coronary artery without angina pectoris: Secondary | ICD-10-CM

## 2020-08-19 DIAGNOSIS — E782 Mixed hyperlipidemia: Secondary | ICD-10-CM

## 2020-08-19 DIAGNOSIS — E785 Hyperlipidemia, unspecified: Secondary | ICD-10-CM

## 2020-08-19 MED ORDER — ROSUVASTATIN CALCIUM 40 MG PO TABS: 40.0000 mg | ORAL_TABLET | Freq: Every day | ORAL | 3 refills | Status: DC

## 2020-08-19 NOTE — Progress Notes (Signed)
The patient has been notified of the result and verbalized understanding. Patient has been taking Simvastatin daily. Advised patient to stop Simvastatin and start Rosuvastatin 40 mg. Simvastatin stopped on patients medication list, Rosuvastatin 40 mg sent to pharmacy. Orders in for repeat labs on 11/19/20. All questions (if any) were answered. Lajoyce Corners, [facility] 08/19/2020 8:38 AM

## 2020-11-19 ENCOUNTER — Other Ambulatory Visit: Payer: Self-pay

## 2020-11-24 ENCOUNTER — Other Ambulatory Visit: Payer: Self-pay

## 2021-08-27 ENCOUNTER — Other Ambulatory Visit: Payer: Self-pay | Admitting: Physician Assistant

## 2021-08-29 ENCOUNTER — Other Ambulatory Visit: Payer: Self-pay | Admitting: Physician Assistant

## 2021-08-29 DIAGNOSIS — I2510 Atherosclerotic heart disease of native coronary artery without angina pectoris: Secondary | ICD-10-CM

## 2021-10-30 ENCOUNTER — Other Ambulatory Visit: Payer: Self-pay | Admitting: Physician Assistant

## 2022-01-10 NOTE — Progress Notes (Signed)
? ?  ?Cardiology Office Note ? ? ?Date:  01/12/2022  ? ?ID:  [name], DOB December 11, 1965, MRN [id number] ? ?PCP:  Patient, No Pcp Per (Inactive)  ?Cardiologist:  Dr. Burt Knack, MD  ? ?Chief Complaint  ?Patient presents with  ? Follow-up  ?  1 year follow up   ? ?History of Present Illness: ?[name] is a 56 y.o. male who presents for annual follow up, seen for Dr. Burt Knack.  ? ?[name] has a hx of CAD s/p NSTEMI in 2013 with PCI/ DES to LAD and POBA to D2 with preserved LVF, HLD, and tobacco use. He initially presented in 2013 with a NSTEMI found to have severe stenosis of the LAD/diagonal bifurcation and was treated with a drug-eluting stent in his LAD and angioplasty of the diagonal. There was minor nonobstructive disease in the left circumflex and right coronary artery and the patient's left ventricular function was normal with an ejection fraction of 55%. Last ETT from 12/2017 was considered low risk.  ? ?He was last seen in follow up 07/29/2020 at which time he was doing quite well with no CV complaints. No changes were made at that time.  ? ?Today he is here alone and reports that he has overall been doing really well from a CV standpoint. He does very strenuous labor and will sometimes find that he has chest heaviness at the end of the day while resting at home. He never has this sensation while working or throughout the day. He has no associated symptoms of diaphoresis, SOB, LE edema, orthopnea, dizziness, or syncope. Last stress test was from 2019 which was low risk. He has not tried SL NTG. We discussed monitoring his symptoms and if chest heaviness worsens or coincides with exertion or reminiscent of prior symptoms, he will call. He has cut back his tobacco intake to a pack every 3-4 days. He is motivated to quit. He needs updated lipids and will return this week for that.  ? ?Past Medical History:  ?Diagnosis Date  ? CAD (coronary artery disease) 03/17/2012  ? S/P NSTEMI in 5/13 >> Tx with PCI  balloon angioplasty diagonal and DES x 1 mid LAD; normal LV function // ETT low risk in 3/19  // Echo 2/19: EF 55-60, Gr 2 DD  ? HLD (hyperlipidemia)   ? Low back pain   ? Obesity   ? Tobacco abuse   ? a. 1 1/2 ppd x 20 yrs (02/2012)  ? ? ?Past Surgical History:  ?Procedure Laterality Date  ? APPENDECTOMY    ? CORONARY STENT PLACEMENT  02/2012  ? balloon PCI to DX; DES to mid LAD  ? HERNIA REPAIR Right 2016  ? INTRAVASCULAR ULTRASOUND  03/17/2012  ? Procedure: INTRAVASCULAR ULTRASOUND;  Surgeon: Burnell Blanks, MD;  Location: [facility] CATH LAB;  Service: Cardiovascular;;  ? LEFT HEART CATHETERIZATION WITH CORONARY ANGIOGRAM N/A 03/17/2012  ? Procedure: LEFT HEART CATHETERIZATION WITH CORONARY ANGIOGRAM;  Surgeon: Burnell Blanks, MD;  Location: [facility] CATH LAB;  Service: Cardiovascular;  Laterality: N/A;  ? ? ? ?Current Outpatient Medications  ?Medication Sig Dispense Refill  ? aspirin 81 MG tablet Take 81 mg by mouth daily.    ? doxycycline (MONODOX) 100 MG capsule Take 1 capsule by mouth 2 (two) times daily.    ? nitroGLYCERIN (NITROSTAT) 0.4 MG SL tablet DISSOLVE ONE TABLET UNDER THE TONGUE EVERY FIVE MINUTES AS NEEDED FOR CHEST PAIN 25 tablet 0  ? rosuvastatin (CRESTOR) 40 MG tablet TAKE  1 TABLET BY MOUTH ONCE DAILY (CONTACT  OFFICE  TO  SCHEDULE  FOLLOW  UP  APPOINTMENT) 90 tablet 2  ? sildenafil (REVATIO) 20 MG tablet Take 2-5 tablets (40-100 mg) once daily as needed prior to sexual activity. 15 tablet 1  ? ?No current facility-administered medications for this visit.  ? ? ?Allergies:   Amoxicillin  ? ? ?Social History:  The patient  reports that he has been smoking cigarettes. He has a 20.00 pack-year smoking history. He has never used smokeless tobacco. He reports current alcohol use of about 8.0 standard drinks per week. He reports that he does not use drugs.  ? ?Family History:  The patient's family history includes COPD in his mother; Cirrhosis in his brother; Diabetes in his father and mother; Healthy  in his brother; Heart attack in his father; Hypertension in his mother; Other in his brother and unknown relative.  ? ? ?ROS:  Please see the history of present illness.    ? ?PHYSICAL EXAM: ?VS:  BP 110/70   Pulse 78   Ht _0  (1.803 m)   Wt 240 lb (108.9 kg)   SpO2 96%   BMI 33.47 kg/m?  , BMI Body mass index is 33.47 kg/m?. ? ?General: Well developed, well nourished, NAD ?Lungs:Clear to ausculation bilaterally. No wheezes, rales, or rhonchi. Breathing is unlabored. ?Cardiovascular: RRR with S1 S2. No murmurs ?Extremities: No edema.  ?Neuro: Alert and oriented. No focal deficits. No facial asymmetry. MAE spontaneously. ?Psych: Responds to questions appropriately with normal affect.   ? ? ?EKG:  EKG is ordered today. ?The ekg ordered today demonstrates NSR with RBBB, similar to prior tracing.  ? ? ?Recent Labs: ?No results found for requested labs within last 8760 hours.  ? ? ?Lipid Panel ?   ?Component Value Date/Time  ? CHOL 202 (H) 08/12/2020 0725  ? TRIG 346 (H) 08/12/2020 0725  ? HDL 37 (L) 08/12/2020 0725  ? CHOLHDL 5.5 (H) 08/12/2020 0725  ? CHOLHDL 3.8 12/15/2017 0420  ? VLDL 29 12/15/2017 0420  ? [place] 106 (H) 08/12/2020 0725  ? LDLDIRECT 81.5 10/29/2013 0904  ? ? ?Wt Readings from Last 3 Encounters:  ?01/12/22 240 lb (108.9 kg)  ?07/29/20 262 lb (118.8 kg)  ?03/06/18 242 lb (109.8 kg)  ?  ?Other studies Reviewed: ?Additional studies/ records that were reviewed today include:  ? ?Exercise Stress Test 12/23/17: ?Ex 9'16", neg adequate ETT, [facility] Treadmill Score 9 ?  ?Echocardiogram 12/15/17: ?Mild LVH, EF 55-60, no RWMA, Gr 2 DD, MAC ?  ?Cardiac catheterization 03/17/12: ?LAD mid 70-80; D1 sub total occlusion, D2 prox 80 ?RCA diffuse mid 30, EF 55 ?PCI:  3 x 28 mm Promus Element DES to mLAD and POBA to D2, D1 too small for PCI ? ?ASSESSMENT AND PLAN: ? ?Coronary artery disease: s/p NSTEMI in 2013 treated with a DES/PCI to the LAD and balloon angioplasty to the D2. Low risk exercise test 12/2017. 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 Not consistent and with no associated symptoms. Never with exertion. Plan to continue current therapies. He will monitor symptoms for now however if symptoms worsen, plan for repeat stress test versus cardiac cath. Discussed risk factor reduction including smoking cessation and HLD control. He will return this week for lipid panel with LFTs. Continue ASA, simvastatin. Can take SL NTG to distinguish angina if symptoms recur.  ?  ?Mixed hyperlipidemia: Last CHO panel with HDL 31, LDL  58 and Trig at 145. Continue  ?simvastatin.  ?  ?Tobacco abuse: Reduced intake to one pack every 3-4 days. Encouraged cessation. Motivated to quit. Offered Chantix however deferred at this time.  ? ?  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 Discussed the importance of regular exercise and healthy diet as well as weight loss ?  ?Current medicines are reviewed at length with the patient today.  The patient does not have concerns regarding medicines. ? ?The following changes have been made:  no change ? ?Labs/ tests ordered today include: Lipid panel, LFTs ? ?Orders Placed This Encounter  ?Procedures  ? Lipid panel  ? Hepatic function panel  ? EKG 12-Lead  ? ?Disposition:   FU with APP in 6 weeks ? ?Signed, ?Kathyrn Drown, NP  ?01/12/2022 2:08 PM    ?[facility] ?[place], [place], [place]  [address] ?Phone: [phone]; Fax: [phone]  ? ?

## 2022-01-12 ENCOUNTER — Encounter: Payer: Self-pay | Admitting: Cardiology

## 2022-01-12 ENCOUNTER — Other Ambulatory Visit: Payer: Self-pay

## 2022-01-12 ENCOUNTER — Ambulatory Visit (INDEPENDENT_AMBULATORY_CARE_PROVIDER_SITE_OTHER): Payer: Self-pay | Admitting: Cardiology

## 2022-01-12 VITALS — BP 110/70 | HR 78 | Ht 71.0 in | Wt 240.0 lb

## 2022-01-12 DIAGNOSIS — E782 Mixed hyperlipidemia: Secondary | ICD-10-CM

## 2022-01-12 DIAGNOSIS — R0789 Other chest pain: Secondary | ICD-10-CM

## 2022-01-12 DIAGNOSIS — Z720 Tobacco use: Secondary | ICD-10-CM

## 2022-01-12 DIAGNOSIS — I2510 Atherosclerotic heart disease of native coronary artery without angina pectoris: Secondary | ICD-10-CM

## 2022-01-12 NOTE — Patient Instructions (Signed)
Medication Instructions:  ?Your physician recommends that you continue on your current medications as directed. Please refer to the Current Medication list given to you today. ? ?*If you need a refill on your cardiac medications before your next appointment, please call your pharmacy* ? ? ?Lab Work: ?Tuesday January 19, 2022: Fasting lipid panel and Liver function test (nothing to eat or drink except water/ black coffee 8-12 hours prior) ? ?If you have labs (blood work) drawn today and your tests are completely normal, you will receive your results only by: ?MyChart Message (if you have MyChart) OR ?A paper copy in the mail ?If you have any lab test that is abnormal or we need to change your treatment, we will call you to review the results. ? ? ?Testing/Procedures: ?NONE ? ? ?Follow-Up: ?At [facility], you and your health needs are our priority.  As part of our continuing mission to provide you with exceptional heart care, we have created designated Provider Care Teams.  These Care Teams include your primary Cardiologist (physician) and Advanced Practice Providers (APPs -  Physician Assistants and Nurse Practitioners) who all work together to provide you with the care you need, when you need it. ? ?We recommend signing up for the patient portal called "MyChart".  Sign up information is provided on this After Visit Summary.  MyChart is used to connect with patients for Virtual Visits (Telemedicine).  Patients are able to view lab/test results, encounter notes, upcoming appointments, etc.  Non-urgent messages can be sent to your provider as well.   ?To learn more about what you can do with MyChart, go to [email or web address].   ? ?Your next appointment:   ?9 month(s) ? ?The format for your next appointment:   ?In Person ? ?Provider:   ?Sherren Mocha, MD  or Richardson Dopp, PA-C       ? ?

## 2022-01-13 ENCOUNTER — Other Ambulatory Visit: Payer: Self-pay

## 2022-01-13 MED ORDER — SILDENAFIL CITRATE 20 MG PO TABS: ORAL_TABLET | ORAL | 2 refills | Status: DC

## 2022-01-13 NOTE — Telephone Encounter (Signed)
Pt calling requesting a refill on sildenafil. Would Dr. Cooper like to refill this medication? Please address ?

## 2022-01-19 ENCOUNTER — Other Ambulatory Visit: Payer: Self-pay

## 2022-01-19 ENCOUNTER — Other Ambulatory Visit: Payer: Self-pay | Admitting: *Deleted

## 2022-01-19 DIAGNOSIS — E782 Mixed hyperlipidemia: Secondary | ICD-10-CM

## 2022-01-19 LAB — LIPID PANEL
Chol/HDL Ratio: 2.8 ratio (ref 0.0–5.0)
Cholesterol, Total: 105 mg/dL (ref 100–199)
HDL: 37 mg/dL — ABNORMAL LOW (ref >39)
LDL Chol Calc (NIH): 46 mg/dL (ref 0–99)
Triglycerides: 124 mg/dL (ref 0–149)
VLDL Cholesterol Cal: 22 mg/dL (ref 5–40)

## 2022-01-19 LAB — HEPATIC FUNCTION PANEL
ALT: 23 IU/L (ref 0–44)
AST: 21 IU/L (ref 0–40)
Albumin: 4.4 g/dL (ref 3.8–4.9)
Alkaline Phosphatase: 85 IU/L (ref 44–121)
Bilirubin Total: 0.5 mg/dL (ref 0.0–1.2)
Bilirubin, Direct: 0.15 mg/dL (ref 0.00–0.40)
Total Protein: 6.3 g/dL (ref 6.0–8.5)

## 2022-02-15 ENCOUNTER — Ambulatory Visit: Payer: Self-pay | Admitting: Physician Assistant

## 2022-05-20 ENCOUNTER — Telehealth: Payer: Self-pay

## 2022-05-20 NOTE — Telephone Encounter (Signed)
Patient walked into office today stating that last night he had Chest pain that he described as heaviness which lasted most of the night and did, eventually, radiate into his L arm. Pt states that he felt nauseous and weak and states "my body aches all over." Pt condones episodes of heavy sweating and recent headache. States that symptoms resolved last night, he went to work this morning and shortly after beginning his shift, symptoms recurred. Pt denies CP at time of walk-in to clinic, but condones other symptoms listed above. Pt has Nitroglycerin, but denies using it d/t terrrible headaches with use. He does not check BP or vitals at home. He repeatedly states "I don't feel like I'm having a heart attack, I feel like I'm coming down with the flu or something." I advised patient that he go to ED for evaluation that while his symptoms are not the same as in 2013 with his NSTEMI (he only presented with bad "acid reflux"), they are very concerning. Pt again states that he doesn't want to do that because he feels he's not having a heart attack. He does use his aspirin and is on rosuvastatin. Educated patient that that's the recommendation, but the choice is ultimately up to him. No DOD spots in our office today, attempted to find an APP schedule to slot him onto so that we could go ahead and do vitals and EKG, but was not successful and again, reiterated need to go to ED. Did let him know, he should try to use a NTG, headache or not, to see if it relieved his symptoms which could help [place] confirm angina or something else going on. Patient seems hesitant, and asks "If it is the flu, or something, then what?" I told him to go to Urgent Care. They can prescribe him medication if it's suspected to be an infection/virus to help with symptoms, but that they also would do an EKG there to rule out cardiac event and send him to hospital if necessary. Pt agrees to plan.

## 2022-05-25 ENCOUNTER — Telehealth: Payer: Self-pay

## 2022-05-25 NOTE — Telephone Encounter (Signed)
[name], pt's Uncle, asked if Lance Nine, NP, take this pt as a new pt.   Contacted pt and was given authorization to go into chart. Pt does not currently have a PCP and was told by Renae Fickle how happy he is with his provider at [facility]. He would like to see Lance Nine, NP, also if he will take him as a new pt. Pt denies any acute problems that need to be address immediately. He just needs to establish care with a Matt. Advised a msg would be sent to [facility] to inquire if he will take him as a new pt and this nurse will f/u with him to get him scheduled if Waller agrees. Pt appreciative and verbalized understanding.

## 2022-05-26 NOTE — Telephone Encounter (Signed)
I would be happy to see him.

## 2022-05-26 NOTE — Telephone Encounter (Signed)
Pt contacted and scheduled for 06/25/22. Pt given clinic number if any changes to apt are needed. Pt verbalized understanding.

## 2022-06-25 ENCOUNTER — Encounter: Payer: Self-pay | Admitting: Nurse Practitioner

## 2022-06-25 ENCOUNTER — Telehealth: Payer: Self-pay | Admitting: Nurse Practitioner

## 2022-06-25 ENCOUNTER — Ambulatory Visit (INDEPENDENT_AMBULATORY_CARE_PROVIDER_SITE_OTHER): Payer: Self-pay | Admitting: Nurse Practitioner

## 2022-06-25 VITALS — BP 130/70 | HR 81 | Temp 97.2°F | Resp 16 | Ht 71.0 in | Wt 247.1 lb

## 2022-06-25 DIAGNOSIS — M654 Radial styloid tenosynovitis [de Quervain]: Secondary | ICD-10-CM | POA: Insufficient documentation

## 2022-06-25 DIAGNOSIS — Z122 Encounter for screening for malignant neoplasm of respiratory organs: Secondary | ICD-10-CM

## 2022-06-25 DIAGNOSIS — Z6834 Body mass index (BMI) 34.0-34.9, adult: Secondary | ICD-10-CM

## 2022-06-25 DIAGNOSIS — Z720 Tobacco use: Secondary | ICD-10-CM

## 2022-06-25 DIAGNOSIS — Z125 Encounter for screening for malignant neoplasm of prostate: Secondary | ICD-10-CM

## 2022-06-25 DIAGNOSIS — I2510 Atherosclerotic heart disease of native coronary artery without angina pectoris: Secondary | ICD-10-CM

## 2022-06-25 DIAGNOSIS — E6609 Other obesity due to excess calories: Secondary | ICD-10-CM

## 2022-06-25 DIAGNOSIS — R319 Hematuria, unspecified: Secondary | ICD-10-CM

## 2022-06-25 DIAGNOSIS — E782 Mixed hyperlipidemia: Secondary | ICD-10-CM

## 2022-06-25 LAB — POC URINALSYSI DIPSTICK (AUTOMATED)
Bilirubin, UA: NEGATIVE
Glucose, UA: NEGATIVE
Nitrite, UA: NEGATIVE
Protein, UA: POSITIVE — AB
Spec Grav, UA: >=1.03 — AB (ref 1.010–1.025)
Urobilinogen, UA: 0.2 E.U./dL
pH, UA: 6 (ref 5.0–8.0)

## 2022-06-25 MED ORDER — PREDNISONE 20 MG PO TABS: ORAL_TABLET | ORAL | 0 refills | Status: AC

## 2022-06-25 NOTE — Progress Notes (Signed)
New Patient Office Visit  Subjective    Patient ID: [name], male    DOB: Apr 05, 1966  Age: 56 y.o. MRN: [id number]  CC:  Chief Complaint  Patient presents with   Establish Care    Previous PCP with [facility] and does see Cardiologist in the chart    HPI [name] presents to establish care  CAD: States that he sees cardiology once a year. And saw them in. Has not had to use nitro  Tobacco abuse: stopped for 3 years at one point. Cold [place] and quit. Plans to quit soon  HLD: tolerates the cholesterol medication well. Gets severe joint pains. States hands and elbow are where he hurts. States approx 7   Hematuria: is a smoking has seen urology in the past.  Colonoscopy: approx 7 years. Prostate: needs updating    Outpatient Encounter Medications as of 06/25/2022  Medication Sig   aspirin 81 MG tablet Take 81 mg by mouth daily.   nitroGLYCERIN (NITROSTAT) 0.4 MG SL tablet DISSOLVE ONE TABLET UNDER THE TONGUE EVERY FIVE MINUTES AS NEEDED FOR CHEST PAIN   predniSONE (DELTASONE) 20 MG tablet Take 1 tablet (20 mg total) by mouth 2 (two) times daily with a meal for 3 days, THEN 1 tablet (20 mg total) daily with breakfast for 3 days. Do not take ibuprofen, motrin, aleve, naproxen, BC/Goody powders with this.   rosuvastatin (CRESTOR) 40 MG tablet TAKE 1 TABLET BY MOUTH ONCE DAILY (CONTACT  OFFICE  TO  SCHEDULE  FOLLOW  UP  APPOINTMENT)   sildenafil (REVATIO) 20 MG tablet Take 2-5 tablets (40-100 mg) once daily as needed prior to sexual activity.   [DISCONTINUED] doxycycline (MONODOX) 100 MG capsule Take 1 capsule by mouth 2 (two) times daily.   No facility-administered encounter medications on file as of 06/25/2022.    Past Medical History:  Diagnosis Date   CAD (coronary artery disease) 03/17/2012   S/P NSTEMI in 5/13 >> Tx with PCI balloon angioplasty diagonal and DES x 1 mid LAD; normal LV function // ETT low risk in 3/19  // Echo 2/19: EF 55-60, Gr 2 DD   HLD  (hyperlipidemia)    Low back pain    Obesity    Tobacco abuse    a. 1 1/2 ppd x 20 yrs (02/2012)    Past Surgical History:  Procedure Laterality Date   APPENDECTOMY     CORONARY STENT PLACEMENT  02/2012   balloon PCI to DX; DES to mid LAD   HERNIA REPAIR Right 2016   INTRAVASCULAR ULTRASOUND  03/17/2012   Procedure: INTRAVASCULAR ULTRASOUND;  Surgeon: Kathleene Hazel, MD;  Location: [facility] CATH LAB;  Service: Cardiovascular;;   LEFT HEART CATHETERIZATION WITH CORONARY ANGIOGRAM N/A 03/17/2012   Procedure: LEFT HEART CATHETERIZATION WITH CORONARY ANGIOGRAM;  Surgeon: Kathleene Hazel, MD;  Location: [facility] CATH LAB;  Service: Cardiovascular;  Laterality: N/A;    Family History  Problem Relation Age of Onset   Heart attack Father        3 mi's - first in 34's.  died @ 39.   Diabetes Father    Hypertension Mother        alive   Diabetes Mother    COPD Mother    Other Unknown        2 brothers alive & well   Healthy Brother    Other Brother        car accident   Cirrhosis Brother     Social  History   Socioeconomic History   Marital status: Legally Separated    Spouse name: Not on file   Number of children: 0   Years of education: Not on file   Highest education level: Not on file  Occupational History   Occupation: Painter  Tobacco Use   Smoking status: Every Day    Packs/day: 1.00    Years: 33.00    Total pack years: 33.00    Types: Cigarettes   Smokeless tobacco: Never  Vaping Use   Vaping Use: Never used  Substance and Sexual Activity   Alcohol use: Yes    Alcohol/week: 8.0 standard drinks of alcohol    Types: 8 Cans of beer per week    Comment: 6 pack per week   Drug use: No   Sexual activity: Yes  Other Topics Concern   Not on file  Social History Narrative   Lives in [place] with girlfriend.  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   Does not routinely exercise or adhere to any particular diet.      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  Respiratory:  Negative for shortness of breath.   Cardiovascular:  Positive for leg swelling. Negative for chest pain.  Gastrointestinal:  Negative for abdominal pain, constipation, diarrhea, nausea and vomiting.       BM daily  Genitourinary:  Negative for dysuria and hematuria.  Neurological:  Negative for tingling and headaches.  Psychiatric/Behavioral:  Negative for hallucinations and suicidal ideas.         Objective    BP 130/70   Pulse 81   Temp (!) 97.2 F (36.2 C)   Resp 16   Ht 5\' 11"  (1.803 m)   Wt 247 lb 2 oz (112.1 kg)   SpO2 95%   BMI 34.47 kg/m   Physical Exam Vitals and nursing note reviewed.  Constitutional:      Appearance: Normal appearance. He is obese.  HENT:     Right Ear: Tympanic membrane, ear canal and external ear normal.     Left Ear: Tympanic membrane, ear canal and external ear normal.     Mouth/Throat:     Mouth: Mucous membranes are moist.     Pharynx: Oropharynx is clear.  Eyes:     Extraocular Movements: Extraocular movements intact.     Pupils: Pupils are equal, round, and reactive to light.  Cardiovascular:     Rate and Rhythm: Normal rate and regular rhythm.     Heart sounds: Normal heart sounds.  Pulmonary:     Effort: Pulmonary effort is normal.     Breath sounds: Normal breath sounds.  Abdominal:     General: Bowel sounds are normal. There is no distension.     Palpations: There is no mass.     Tenderness: There is no abdominal tenderness.     Hernia: No hernia is present.  Musculoskeletal:     Right lower leg: Edema present.     Left lower leg: Edema present.     Comments:  "+" Finkelstein test, on the right side  Lymphadenopathy:  Cervical: No cervical adenopathy.  Skin:    General: Skin is warm.  Neurological:     General: No focal deficit present.     Mental Status: He is alert.     Deep Tendon Reflexes:     Reflex Scores:      Bicep reflexes are 2+ on the right side and 2+ on the left side.      Patellar reflexes are 2+ on the right side and 2+ on the left side.    Comments: Bilateral upper and lower extremity strength 5/5  Psychiatric:        Mood and Affect: Mood normal.        Behavior: Behavior normal.        Thought Content: Thought content normal.        Judgment: Judgment normal.         Assessment & Plan:   Problem List Items Addressed This Visit       Cardiovascular and Mediastinum   CAD (coronary artery disease)    Patient is followed by cardiology states he does have joint pains and wonders related to his statin.  Will not take him off his statin currently as he is high risk for cardiovascular events.  We will consult cardiology prior to doing it.  He is taking medications prescribed follow-up as recommended      Relevant Orders   CBC   BASIC METABOLIC PANEL WITH GFR     Musculoskeletal and Integument   Tenosynovitis, de Quervain    Start prednisone 20 mg taper in office.  Prednisone precautions reviewed      Relevant Medications   predniSONE (DELTASONE) 20 MG tablet     Other   Hematuria    Historical diagnosis.  Patient has seen now at urology back in 2014.  We will send off microscopy to see if this is true hematuria.  Patient still currently smoking      Relevant Medications   predniSONE (DELTASONE) 20 MG tablet   Screening for prostate cancer   Relevant Orders   PSA   Tobacco abuse - Primary    Patient still smokes.  States pack a day for approximately 30 years.  Did do ambulatory referral to low-dose CT scan patient is self pay and uninsured told patient to check with them about cost of his insulin is  affordable or not.      Relevant Orders   Ambulatory Referral Lung Cancer Screening [facility]   Urine Microscopic   POCT Urinalysis Dipstick (Automated) (Completed)   Hyperlipidemia    Patient currently rosuvastatin 40 mg.  Continue taking as prescribed      Class 1 obesity due to excess calories with serious comorbidity and body mass index (BMI) of 34.0 to 34.9 in adult    Pending labs in office.      Relevant Orders   Hemoglobin A1c   Other Visit Diagnoses     Screening for lung cancer           Return in about 1 year (around 06/26/2023).   Romilda Garret, NP

## 2022-06-25 NOTE — Assessment & Plan Note (Signed)
Pending labs in office.

## 2022-06-25 NOTE — Assessment & Plan Note (Signed)
Historical diagnosis.  Patient has seen now at urology back in 2014.  We will send off microscopy to see if this is true hematuria.  Patient still currently smoking

## 2022-06-25 NOTE — Assessment & Plan Note (Signed)
Patient still smokes.  States pack a day for approximately 30 years.  Did do ambulatory referral to low-dose CT scan patient is self pay and uninsured told patient to check with them about cost of his insulin is affordable or not.

## 2022-06-25 NOTE — Assessment & Plan Note (Signed)
Start prednisone 20 mg taper in office.  Prednisone precautions reviewed

## 2022-06-25 NOTE — Patient Instructions (Signed)
Nice to see you today I will be in touch with the labs once I have the results Follow up with me in 1 year sooner if you need me

## 2022-06-25 NOTE — Assessment & Plan Note (Signed)
Patient is followed by cardiology states he does have joint pains and wonders related to his statin.  Will not take him off his statin currently as he is high risk for cardiovascular events.  We will consult cardiology prior to doing it.  He is taking medications prescribed follow-up as recommended

## 2022-06-25 NOTE — Telephone Encounter (Signed)
[name] in office. He was requesting refill for nitro. Has not used it. Will defer to you guys since he recently saw you.  Also mentioned he has been having joint pains and wondered if it was related to a his statin. Since he has had an event and strong FH I told him I would ask your group prior to taking a holiday to see if that helped.  Thanks, [employer]

## 2022-06-25 NOTE — Assessment & Plan Note (Signed)
Patient currently rosuvastatin 40 mg.  Continue taking as prescribed

## 2022-06-26 LAB — BASIC METABOLIC PANEL WITH GFR
BUN: 19 mg/dL (ref 7–25)
CO2: 24 mmol/L (ref 20–32)
Calcium: 9 mg/dL (ref 8.6–10.3)
Chloride: 107 mmol/L (ref 98–110)
Creat: 1.13 mg/dL (ref 0.70–1.30)
Glucose, Bld: 103 mg/dL — ABNORMAL HIGH (ref 65–99)
Potassium: 4.4 mmol/L (ref 3.5–5.3)
Sodium: 142 mmol/L (ref 135–146)
eGFR: 77 mL/min/{1.73_m2} (ref >=60)

## 2022-06-26 LAB — URINALYSIS, MICROSCOPIC ONLY
Bacteria, UA: NONE SEEN /HPF
Hyaline Cast: NONE SEEN /LPF
Squamous Epithelial / HPF: NONE SEEN /HPF (ref <=5)
WBC, UA: NONE SEEN /HPF (ref 0–5)

## 2022-06-26 LAB — PSA: PSA: 0.64 ng/mL (ref <=4.00)

## 2022-06-26 LAB — CBC
HCT: 43 % (ref 38.5–50.0)
Hemoglobin: 14.6 g/dL (ref 13.2–17.1)
MCH: 30.6 pg (ref 27.0–33.0)
MCHC: 34 g/dL (ref 32.0–36.0)
MCV: 90.1 fL (ref 80.0–100.0)
MPV: 10.4 fL (ref 7.5–12.5)
Platelets: 255 10*3/uL (ref 140–400)
RBC: 4.77 10*6/uL (ref 4.20–5.80)
RDW: 12.5 % (ref 11.0–15.0)
WBC: 8.8 10*3/uL (ref 3.8–10.8)

## 2022-06-26 LAB — HEMOGLOBIN A1C
Hgb A1c MFr Bld: 5.1 % of total Hgb (ref <5.7)
Mean Plasma Glucose: 100 mg/dL
eAG (mmol/L): 5.5 mmol/L

## 2022-06-29 ENCOUNTER — Other Ambulatory Visit: Payer: Self-pay | Admitting: Cardiology

## 2022-06-29 DIAGNOSIS — I2510 Atherosclerotic heart disease of native coronary artery without angina pectoris: Secondary | ICD-10-CM

## 2022-06-29 MED ORDER — NITROGLYCERIN 0.4 MG SL SUBL: SUBLINGUAL_TABLET | SUBLINGUAL | 0 refills | Status: DC

## 2022-06-29 NOTE — Telephone Encounter (Signed)
Called and spoke with he said that is willing switch cholesterol medication , to help with pain. Pt need to have this sent to [facility] on [address].

## 2022-06-29 NOTE — Telephone Encounter (Signed)
Spoke with cardiology. States they do not want him to come off of cholesterol medication but we can switch the medication to see if that makes a difference with his pains. Let me know what he thinks

## 2022-06-30 ENCOUNTER — Other Ambulatory Visit: Payer: Self-pay | Admitting: Nurse Practitioner

## 2022-06-30 DIAGNOSIS — E782 Mixed hyperlipidemia: Secondary | ICD-10-CM

## 2022-06-30 MED ORDER — ATORVASTATIN CALCIUM 40 MG PO TABS: 40.0000 mg | ORAL_TABLET | Freq: Every day | ORAL | 0 refills | Status: DC

## 2022-06-30 NOTE — Progress Notes (Signed)
Patient advised.

## 2022-06-30 NOTE — Progress Notes (Signed)
Can we let the patient know to stop the Crestor and we will start atorvastatin 40mg  instead. I have already sent a 90 day supply into the pharmacy. FYI to cardiology and request they take over the management of script

## 2022-09-22 NOTE — Progress Notes (Unsigned)
    [name] Pinette, MD, CAQ Sports Medicine [facility] at [facility] [address] [place], [address]  Phone: [phone]  FAX: [phone]  [name] - 56 y.o. male  MRN [id number]  Date of Birth: 12/18/1965  Date: 09/23/2022  PCP: Eden Emms, NP  Referral: Eden Emms, NP  No chief complaint on file.  Subjective:   [name] is a 56 y.o. very pleasant male patient with There is no height or weight on file to calculate BMI. who presents with the following:  Pleasant gentleman who presents with a question of his infected leg.    Review of Systems is noted in the HPI, as appropriate  Objective:   There were no vitals taken for this visit.  GEN: No acute distress; alert,appropriate. PULM: Breathing comfortably in no respiratory distress PSYCH: Normally interactive.   Laboratory and Imaging Data:  Assessment and Plan:   ***

## 2022-09-23 ENCOUNTER — Ambulatory Visit (INDEPENDENT_AMBULATORY_CARE_PROVIDER_SITE_OTHER): Payer: Self-pay | Admitting: Family Medicine

## 2022-09-23 ENCOUNTER — Encounter: Payer: Self-pay | Admitting: Family Medicine

## 2022-09-23 ENCOUNTER — Ambulatory Visit: Payer: Self-pay | Admitting: Family Medicine

## 2022-09-23 VITALS — BP 120/72 | HR 71 | Temp 98.1°F | Ht 71.0 in | Wt 243.0 lb

## 2022-09-23 DIAGNOSIS — S8011XA Contusion of right lower leg, initial encounter: Secondary | ICD-10-CM

## 2022-11-27 ENCOUNTER — Other Ambulatory Visit: Payer: Self-pay | Admitting: Nurse Practitioner

## 2022-11-27 DIAGNOSIS — E782 Mixed hyperlipidemia: Secondary | ICD-10-CM

## 2023-01-11 ENCOUNTER — Encounter: Payer: Self-pay | Admitting: Cardiovascular Disease

## 2023-01-11 ENCOUNTER — Ambulatory Visit: Payer: 59 | Attending: Cardiovascular Disease | Admitting: Cardiovascular Disease

## 2023-01-11 VITALS — BP 135/79 | HR 78 | Ht 71.0 in | Wt 241.0 lb

## 2023-01-11 DIAGNOSIS — E782 Mixed hyperlipidemia: Secondary | ICD-10-CM | POA: Diagnosis not present

## 2023-01-11 DIAGNOSIS — I2510 Atherosclerotic heart disease of native coronary artery without angina pectoris: Secondary | ICD-10-CM | POA: Diagnosis not present

## 2023-01-11 DIAGNOSIS — Z720 Tobacco use: Secondary | ICD-10-CM

## 2023-01-11 NOTE — Patient Instructions (Signed)
Medication Instructions:  Your physician recommends that you continue on your current medications as directed. Please refer to the Current Medication list given to you today.  *If you need a refill on your cardiac medications before your next appointment, please call your pharmacy*  Follow-Up: At [facility], you and your health needs are our priority.  As part of our continuing mission to provide you with exceptional heart care, we have created designated Provider Care Teams.  These Care Teams include your primary Cardiologist (physician) and Advanced Practice Providers (APPs -  Physician Assistants and Nurse Practitioners) who all work together to provide you with the care you need, when you need it.  Your next appointment:   1 year(s)  Provider:   Sherren Mocha, MD

## 2023-01-11 NOTE — Progress Notes (Signed)
Cardiology Office Note:    Date:  01/11/2023   ID:  [name], DOB 1966/09/28, MRN [id number]  PCP:  Lance Pitcher, NP   [place] Providers Cardiologist:  Lance Mocha, MD     Referring MD: Lance Pitcher, NP   Chief Complaint  Patient presents with   Coronary Artery Disease    History of Present Illness:    [name] is a 57 y.o. male with a hx of: Coronary artery disease  S/p NSTEMI in 2013 >>PCI: DES to LAD and POBA to D2  Preserved LVF Hyperlipidemia  Tobacco abuse   The patient is here alone today.  He is doing pretty well.  He does hard physical work with no exertional symptoms.  He denies chest pain, chest pressure, or shortness of breath.  No heart palpitations, orthopnea, or PND.  He is compliant with his aspirin and atorvastatin.  He has labs checked annually by his PCP.  His last physical was in September 2023.  His last lipids were at goal with an LDL cholesterol less than 55 mg/dL.  Past Medical History:  Diagnosis Date   CAD (coronary artery disease) 03/17/2012   S/P NSTEMI in 5/13 >> Tx with PCI balloon angioplasty diagonal and DES x 1 mid LAD; normal LV function // ETT low risk in 3/19  // Echo 2/19: EF 55-60, Gr 2 DD   HLD (hyperlipidemia)    Low back pain    Obesity    Tobacco abuse    a. 1 1/2 ppd x 20 yrs (02/2012)    Past Surgical History:  Procedure Laterality Date   APPENDECTOMY     CORONARY STENT PLACEMENT  02/2012   balloon PCI to DX; DES to mid LAD   HERNIA REPAIR Right 2016   INTRAVASCULAR ULTRASOUND  03/17/2012   Procedure: INTRAVASCULAR ULTRASOUND;  Surgeon: Lance Blanks, MD;  Location: [facility] CATH LAB;  Service: Cardiovascular;;   LEFT HEART CATHETERIZATION WITH CORONARY ANGIOGRAM N/A 03/17/2012   Procedure: LEFT HEART CATHETERIZATION WITH CORONARY ANGIOGRAM;  Surgeon: Lance Blanks, MD;  Location: [facility] CATH LAB;  Service: Cardiovascular;  Laterality: N/A;    Current Medications: Current Meds   Medication Sig   aspirin 81 MG tablet Take 81 mg by mouth daily.   atorvastatin (LIPITOR) 40 MG tablet Take 1 tablet by mouth once daily   nitroGLYCERIN (NITROSTAT) 0.4 MG SL tablet DISSOLVE ONE TABLET UNDER THE TONGUE EVERY FIVE MINUTES AS NEEDED FOR CHEST PAIN   sildenafil (REVATIO) 20 MG tablet Take 2-5 tablets (40-100 mg) once daily as needed prior to sexual activity.     Allergies:   Amoxicillin   Social History   Socioeconomic History   Marital status: Legally Separated    Spouse name: Not on file   Number of children: 0   Years of education: Not on file   Highest education level: Not on file  Occupational History   Occupation: Painter  Tobacco Use   Smoking status: Every Day    Packs/day: 1.00    Years: 33.00    Additional pack years: 0.00    Total pack years: 33.00    Types: Cigarettes   Smokeless tobacco: Never  Vaping Use   Vaping Use: Never used  Substance and Sexual Activity   Alcohol use: Yes    Alcohol/week: 8.0 standard drinks of alcohol    Types: 8 Cans of beer per week    Comment: 6 pack per week   Drug use:  No   Sexual activity: Yes  Other Topics Concern   Not on file  Social History Narrative   Lives in [place] with girlfriend.  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   Does not routinely exercise or adhere to any particular diet.      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  ROS:   Please see the history of present illness.    All other systems  reviewed and are negative.  EKGs/Labs/Other Studies Reviewed:    The following studies were reviewed today: Cardiac Studies & Procedures     STRESS TESTS  EXERCISE TOLERANCE TEST (ETT) 12/23/2017  Narrative  Blood pressure demonstrated a normal response to exercise.  There was no ST segment deviation noted during stress.  ETT with good exercise tolerance (9:16); no chest pain; normal BP response; no ST changes; negative adequate ETT; duke treadmill score 9.   ECHOCARDIOGRAM  ECHOCARDIOGRAM COMPLETE 12/15/2017  Narrative *[facility]* [address],  [address] [phone]  ------------------------------------------------------------------- Transthoracic Echocardiography  Patient:    [name] MR #:       [id number] Study Date: 12/15/2017 Gender:     M Age:        66 Height:     180.3 cm Weight:     106.6 kg BSA:        2.34 m^2 Pt. Status: Room:  SONOGRAPHER  [name] ADMITTING    [name] [name] [name] ATTENDING    [name] PERFORMING   [name]  cc:  ------------------------------------------------------------------- LV EF: 55% -   60%  ------------------------------------------------------------------- Indications:      Chest pain 786.51.  ------------------------------------------------------------------- History:   PMH:  Obesity.  Coronary artery disease.  Risk factors: Current tobacco use. Hypertension. Dyslipidemia.  ------------------------------------------------------------------- Study Conclusions  - Left ventricle: The cavity size was mildly dilated. Wall thickness was increased in a pattern of mild LVH. Systolic function was normal. The estimated ejection fraction was in the range of 55% to 60%. Wall motion was normal; there were no regional wall motion abnormalities.  Features are consistent with a pseudonormal left ventricular filling pattern, with concomitant abnormal relaxation and increased filling pressure (grade 2 diastolic dysfunction). - Mitral valve: Calcified annulus.  Impressions:  - Normal LV systolic function; mild LVH and LVE; moderate diastolic dysfunction.  ------------------------------------------------------------------- Study Goodwin:  Comparison was made to the study of 03/19/2012.  Study status:  Routine.  Procedure:  The patient reported no pain pre or post test. Transthoracic echocardiography. Image quality was adequate.  Study completion:  There were no complications. Transthoracic echocardiography.  M-mode, complete 2D, spectral Doppler, and color Doppler.  Birthdate:  Patient birthdate: May 13, 1966.  Age:  Patient is 57 yr old.  Sex:  Gender: male. BMI: 32.8 kg/m^2.  Blood pressure:     117/66  Patient status: Goodwin.  Study date:  Study date: 12/15/2017. Study time: 09:51 AM.  Location:  Echo laboratory.  -------------------------------------------------------------------  ------------------------------------------------------------------- Left ventricle:  The cavity size was mildly dilated. Wall thickness was increased in a pattern of mild LVH. Systolic function was normal. The estimated ejection fraction was in the range of 55% to 60%. Wall motion was normal; there were no regional wall motion abnormalities. Features are consistent with a pseudonormal left ventricular filling pattern, with concomitant abnormal relaxation and increased filling pressure (grade 2 diastolic dysfunction).  ------------------------------------------------------------------- Aortic valve:   Trileaflet; mildly thickened leaflets. Mobility was not restricted.  Doppler:  Transvalvular velocity was within the normal range. There was no stenosis. There was no  regurgitation.  ------------------------------------------------------------------- Aorta:  Aortic root: The aortic root was normal in size.  ------------------------------------------------------------------- Mitral valve:   Calcified annulus. Mobility was not restricted. Doppler:  Transvalvular velocity was within the normal range. There was no evidence for stenosis. There was no regurgitation.    Peak gradient (D): 2 mm Hg.  ------------------------------------------------------------------- Left atrium:  The atrium was normal in size.  ------------------------------------------------------------------- Right ventricle:  The cavity size was normal. Systolic function was normal.  ------------------------------------------------------------------- Pulmonic valve:    Doppler:  Transvalvular velocity was within the normal range. There was no evidence for stenosis.  ------------------------------------------------------------------- Tricuspid valve:   Structurally normal valve.    Doppler: Transvalvular velocity was within the normal range. There was trivial regurgitation.  ------------------------------------------------------------------- Pulmonary artery:   Systolic pressure was within the normal range.  ------------------------------------------------------------------- Right atrium:  The atrium was normal in size.  ------------------------------------------------------------------- Pericardium:  There was no pericardial effusion.  ------------------------------------------------------------------- Systemic veins: Inferior vena cava: The vessel was normal in size.  ------------------------------------------------------------------- Measurements  Left ventricle                          Value        Reference LV ID, ED, PLAX chordal         (H)     53.13 mm     43 - 52 LV ID, ES, PLAX chordal         (H)     43    mm     23 - 38 LV fx shortening, PLAX chordal  (L)     19     %      >=29 LV PW thickness, ED                     13    mm     ---------- IVS/LV PW ratio, ED                     0.94         <=1.3 Stroke volume, 2D                       137   ml     ---------- Stroke volume/bsa, 2D                   58    ml/m^2 ---------- LV e&', lateral                          11    cm/s   ---------- LV E/e&', lateral  6.62         ---------- LV e&', medial                           6.96  cm/s   ---------- LV E/e&', medial                         10.46        ---------- LV e&', average                          8.98  cm/s   ---------- LV E/e&', average                        8.11         ----------  Ventricular septum                      Value        Reference IVS thickness, ED                       12.2  mm     ----------  LVOT                                    Value        Reference LVOT ID, S                              25    mm     ---------- LVOT area                               4.91  cm^2   ---------- LVOT peak velocity, S                   138   cm/s   ---------- LVOT mean velocity, S                   93.5  cm/s   ---------- LVOT VTI, S                             28    cm     ---------- LVOT peak gradient, S                   8     mm Hg  ----------  Aorta                                   Value        Reference Aortic root ID, ED                      36    mm     ----------  Left atrium                             Value        Reference LA ID, A-P, ES  37    mm     ---------- LA ID/bsa, A-P                          1.58  cm/m^2 <=2.2 LA volume, S                            53.7  ml     ---------- LA volume/bsa, S                        22.9  ml/m^2 ---------- LA volume, ES, 1-p A4C                  49.5  ml     ---------- LA volume/bsa, ES, 1-p A4C              21.1  ml/m^2 ---------- LA volume, ES, 1-p A2C                  57    ml     ---------- LA volume/bsa, ES, 1-p A2C              24.3   ml/m^2 ----------  Mitral valve                            Value        Reference Mitral E-wave peak velocity             72.8  cm/s   ---------- Mitral A-wave peak velocity             62.1  cm/s   ---------- Mitral deceleration time        (H)     246   ms     150 - 230 Mitral peak gradient, D                 2     mm Hg  ---------- Mitral E/A ratio, peak                  1.2          ----------  Pulmonary arteries                      Value        Reference PA pressure, S, DP                      15    mm Hg  <=30  Tricuspid valve                         Value        Reference Tricuspid regurg peak velocity          172   cm/s   ---------- Tricuspid peak RV-RA gradient           12    mm Hg  ----------  Right atrium                            Value        Reference RA ID, S-I, ES, A4C             (H)     50.8  mm     34 -  49 RA area, ES, A4C                        17.1  cm^2   8.3 - 19.5 RA volume, ES, A/L                      46.5  ml     ---------- RA volume/bsa, ES, A/L                  19.8  ml/m^2 ----------  Systemic veins                          Value        Reference Estimated CVP                           3     mm Hg  ----------  Right ventricle                         Value        Reference RV ID, minor axis, ED, A4C base         33    mm     ---------- TAPSE                                   28.7  mm     ---------- RV pressure, S, DP                      15    mm Hg  <=30 RV s&', lateral, S                       12.8  cm/s   ----------  Legend: (L)  and  (H)  mark values outside specified reference range.  ------------------------------------------------------------------- Prepared and Electronically Authenticated by  Kirk Ruths 2019-02-21T14:50:11              EKG:  EKG is ordered today.  The ekg ordered today demonstrates normal sinus rhythm 78 bpm, right bundle branch block, no significant change from previous tracings.  Recent Labs: 01/19/2022: ALT  23 06/25/2022: BUN 19; Creat 1.13; Hemoglobin 14.6; Platelets 255; Potassium 4.4; Sodium 142  Recent Lipid Panel    Component Value Date/Time   CHOL 105 01/19/2022 0718   TRIG 124 01/19/2022 0718   HDL 37 (L) 01/19/2022 0718   CHOLHDL 2.8 01/19/2022 0718   CHOLHDL 3.8 12/15/2017 0420   VLDL 29 12/15/2017 0420   LDLCALC 46 01/19/2022 0718   LDLDIRECT 81.5 10/29/2013 0904     Risk Assessment/Calculations:                Physical Exam:    VS:  BP 135/79 (BP Location: Left Arm, Patient Position: Sitting, Cuff Size: Normal)   Pulse 78   Ht 5\' 11"  (1.803 m)   Wt 241 lb (109.3 kg)   BMI 33.61 kg/m     Wt Readings from Last 3 Encounters:  01/11/23 241 lb (109.3 kg)  09/23/22 243 lb (110.2 kg)  06/25/22 247 lb 2 oz (112.1 kg)     GEN:  Well nourished, well developed in no acute distress HEENT: Normal NECK: No JVD; No carotid bruits LYMPHATICS: No lymphadenopathy CARDIAC:  RRR, no murmurs, rubs, gallops RESPIRATORY:  Clear to auscultation without rales, wheezing or rhonchi  ABDOMEN: Soft, non-tender, non-distended MUSCULOSKELETAL:  No edema; No deformity  SKIN: Warm and dry NEUROLOGIC:  Alert and oriented x 3 PSYCHIATRIC:  Normal affect   ASSESSMENT:    1. Coronary artery disease involving native coronary artery of native heart without angina pectoris   2. Mixed hyperlipidemia   3. Tobacco abuse    PLAN:    In order of problems listed above:  No anginal symptoms.  Continue aspirin for antiplatelet therapy and high intensity statin drug.  Discussed lifestyle modification and tobacco cessation measures. Last lipid panel showed cholesterol 105, HDL 37, LDL 46, triglycerides 124.  This was a marked improvement from his previous labs.  He will continue on atorvastatin 40 mg daily.  He will have follow-up lipids and LFTs drawn by his PCP at the time of his annual physical this year. Cessation counseling done.  Discussed pharmacotherapy such as Chantix or Wellbutrin.   Patient favors trying to quit cold [place].     Medication Adjustments/Labs and Tests Ordered: Current medicines are reviewed at length with the patient today.  Concerns regarding medicines are outlined above.  Orders Placed This Encounter  Procedures   EKG 12-Lead   No orders of the defined types were placed in this encounter.   Patient Instructions  Medication Instructions:  Your physician recommends that you continue on your current medications as directed. Please refer to the Current Medication list given to you today.  *If you need a refill on your cardiac medications before your next appointment, please call your pharmacy*  Follow-Up: At [facility], you and your health needs are our priority.  As part of our continuing mission to provide you with exceptional heart care, we have created designated Provider Care Teams.  These Care Teams include your primary Cardiologist (physician) and Advanced Practice Providers (APPs -  Physician Assistants and Nurse Practitioners) who all work together to provide you with the care you need, when you need it.  Your next appointment:   1 year(s)  Provider:   Sherren Mocha, MD       Signed, Lance Mocha, MD  01/11/2023 6:00 PM    [place]

## 2023-05-09 ENCOUNTER — Ambulatory Visit (INDEPENDENT_AMBULATORY_CARE_PROVIDER_SITE_OTHER): Payer: 59 | Admitting: Internal Medicine

## 2023-05-09 ENCOUNTER — Telehealth: Payer: Self-pay

## 2023-05-09 ENCOUNTER — Encounter: Payer: Self-pay | Admitting: Internal Medicine

## 2023-05-09 VITALS — BP 100/64 | HR 74 | Temp 97.6°F | Ht 71.0 in | Wt 230.0 lb

## 2023-05-09 DIAGNOSIS — J0110 Acute frontal sinusitis, unspecified: Secondary | ICD-10-CM

## 2023-05-09 MED ORDER — DOXYCYCLINE HYCLATE 100 MG PO TABS: 100.0000 mg | ORAL_TABLET | Freq: Two times a day (BID) | ORAL | 0 refills | Status: DC

## 2023-05-09 NOTE — Telephone Encounter (Signed)
Noted. Appreciate Dr. Alphonsus Sias for seeing the patient

## 2023-05-09 NOTE — Telephone Encounter (Signed)
Received call from Lupita Leash and Dairl Ponder concerning pt with cough and HA x 10 days. They requested to see if an apt was available with PCP. Advised no apts today with PCP but pt has already been schedule with Dr. Alphonsus Sias for 12:15. Advised PCP has an apt open tomorrow and Wednesday if f/u is needed. Renae Fickle and Lupita Leash were appreciative of the help and verbalized understanding.

## 2023-05-09 NOTE — Assessment & Plan Note (Signed)
1 week of symptoms Discussed symptom relief Doubt RMSF---no rash, etc Will give doxy 100 bid x 10 days --for the sinus (and just in case) Discussed care with sun exposure

## 2023-05-09 NOTE — Progress Notes (Signed)
Subjective:    Patient ID: [name], male    DOB: [birth year], 57 y.o.   MRN: [id number]  HPI Here due to respiratory illness  Sick for about a week Heavy sweats, bad headaches (frontal), body aches, lightheadedness Some cough--with sputum Bad congestion with some post nasal drip Thinks he has had fever No sore throat Some popping in left ear--but no pain No SOB  Taking coricidin/tylenol---helps some  Current Outpatient Medications on File Prior to Visit  Medication Sig Dispense Refill   aspirin 81 MG tablet Take 81 mg by mouth daily.     atorvastatin (LIPITOR) 40 MG tablet Take 1 tablet by mouth once daily 90 tablet 1   nitroGLYCERIN (NITROSTAT) 0.4 MG SL tablet DISSOLVE ONE TABLET UNDER THE TONGUE EVERY FIVE MINUTES AS NEEDED FOR CHEST PAIN 25 tablet 0   sildenafil (REVATIO) 20 MG tablet Take 2-5 tablets (40-100 mg) once daily as needed prior to sexual activity. 15 tablet 2   No current facility-administered medications on file prior to visit.    Allergies  Allergen Reactions   Amoxicillin     Rash     Past Medical History:  Diagnosis Date   CAD (coronary artery disease) 03/17/2012   S/P NSTEMI in 5/13 >> Tx with PCI balloon angioplasty diagonal and DES x 1 mid LAD; normal LV function // ETT low risk in 3/19  // Echo 2/19: EF 55-60, Gr 2 DD   HLD (hyperlipidemia)    Low back pain    Obesity    Tobacco abuse    a. 1 1/2 ppd x 20 yrs (02/2012)    Past Surgical History:  Procedure Laterality Date   APPENDECTOMY     CORONARY STENT PLACEMENT  02/2012   balloon PCI to DX; DES to mid LAD   HERNIA REPAIR Right 2016   INTRAVASCULAR ULTRASOUND  03/17/2012   Procedure: INTRAVASCULAR ULTRASOUND;  Surgeon: Kathleene Hazel, MD;  Location: [facility] CATH LAB;  Service: Cardiovascular;;   LEFT HEART CATHETERIZATION WITH CORONARY ANGIOGRAM N/A 03/17/2012   Procedure: LEFT HEART CATHETERIZATION WITH CORONARY ANGIOGRAM;  Surgeon: Kathleene Hazel, MD;  Location: [facility]  CATH LAB;  Service: Cardiovascular;  Laterality: N/A;    Family History  Problem Relation Age of Onset   Heart attack Father        3 mi's - first in 63's.  died @ 69.   Diabetes Father    Hypertension Mother        alive   Diabetes Mother    COPD Mother    Other Unknown        2 brothers alive & well   Healthy Brother    Other Brother        car accident   Cirrhosis Brother     Social History   Socioeconomic History   Marital status: Legally Separated    Spouse name: Not on file   Number of children: 0   Years of education: Not on file   Highest education level: Not on file  Occupational History   Occupation: Education administrator  Tobacco Use   Smoking status: Every Day    Current packs/day: 1.00    Average packs/day: 1 pack/day for 33.0 years (33.0 ttl pk-yrs)    Types: Cigarettes   Smokeless tobacco: Never  Vaping Use   Vaping status: Never Used  Substance and Sexual Activity   Alcohol use: Yes    Alcohol/week: 8.0 standard drinks of alcohol    Types: 8 Cans  of beer per week    Comment: 6 pack per week   Drug use: No   Sexual activity: Yes  Other Topics Concern   Not on file  Social History Narrative   Lives in [place] with girlfriend.  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   Does not routinely exercise or adhere to any particular diet.      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 Has had some ticks last week    Objective:   Physical Exam Constitutional:      Appearance: Normal appearance.  HENT:     Head:     Comments: Frontal tenderness    Right Ear: Tympanic membrane and  ear canal normal.     Left Ear: Tympanic membrane and ear canal normal.     Mouth/Throat:     Comments: Slight pharyngeal injection Pulmonary:     Effort: Pulmonary effort is normal.     Breath sounds: Normal breath sounds. No wheezing or rales.  Musculoskeletal:     Cervical back: Neck supple.  Lymphadenopathy:     Cervical: No cervical adenopathy.  Neurological:     Mental Status: He is alert.            Assessment & Plan:

## 2023-06-21 ENCOUNTER — Encounter: Payer: Self-pay | Admitting: *Deleted

## 2023-07-29 ENCOUNTER — Other Ambulatory Visit: Payer: Self-pay | Admitting: Nurse Practitioner

## 2023-07-29 DIAGNOSIS — Z1212 Encounter for screening for malignant neoplasm of rectum: Secondary | ICD-10-CM

## 2023-07-29 DIAGNOSIS — Z1211 Encounter for screening for malignant neoplasm of colon: Secondary | ICD-10-CM

## 2023-08-25 ENCOUNTER — Ambulatory Visit (INDEPENDENT_AMBULATORY_CARE_PROVIDER_SITE_OTHER): Payer: 59 | Admitting: Nurse Practitioner

## 2023-08-25 VITALS — BP 110/68 | HR 72 | Temp 98.5°F | Ht 71.0 in | Wt 234.0 lb

## 2023-08-25 DIAGNOSIS — Z720 Tobacco use: Secondary | ICD-10-CM

## 2023-08-25 DIAGNOSIS — Z125 Encounter for screening for malignant neoplasm of prostate: Secondary | ICD-10-CM

## 2023-08-25 DIAGNOSIS — E669 Obesity, unspecified: Secondary | ICD-10-CM | POA: Diagnosis not present

## 2023-08-25 DIAGNOSIS — I2510 Atherosclerotic heart disease of native coronary artery without angina pectoris: Secondary | ICD-10-CM | POA: Diagnosis not present

## 2023-08-25 DIAGNOSIS — Z131 Encounter for screening for diabetes mellitus: Secondary | ICD-10-CM | POA: Diagnosis not present

## 2023-08-25 DIAGNOSIS — R229 Localized swelling, mass and lump, unspecified: Secondary | ICD-10-CM | POA: Diagnosis not present

## 2023-08-25 DIAGNOSIS — E782 Mixed hyperlipidemia: Secondary | ICD-10-CM

## 2023-08-25 DIAGNOSIS — N529 Male erectile dysfunction, unspecified: Secondary | ICD-10-CM | POA: Diagnosis not present

## 2023-08-25 DIAGNOSIS — F17200 Nicotine dependence, unspecified, uncomplicated: Secondary | ICD-10-CM

## 2023-08-25 MED ORDER — SILDENAFIL CITRATE 50 MG PO TABS: 25.0000 mg | ORAL_TABLET | Freq: Every day | ORAL | 0 refills | Status: AC | PRN

## 2023-08-25 MED ORDER — ATORVASTATIN CALCIUM 40 MG PO TABS: 40.0000 mg | ORAL_TABLET | Freq: Every day | ORAL | 2 refills | Status: DC
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
## 2023-08-25 NOTE — Patient Instructions (Signed)
Nice to see you today I will be in touch with the labs once I have them Follow up with me in 6-8 weeks for your physical

## 2023-08-25 NOTE — Assessment & Plan Note (Signed)
Patient has difficulty maintaining and obtaining an erection status post myocardial infarction.  Sildenafil 25 to 50 mg as needed prior to sexual intercourse.  Patient knows interaction between sildenafil and nitroglycerin.

## 2023-08-25 NOTE — Assessment & Plan Note (Signed)
History of the same patient needs a refill on atorvastatin pending labs refill provided

## 2023-08-25 NOTE — Assessment & Plan Note (Signed)
Pending urine microscopy to rule out microscopic hematuria

## 2023-08-25 NOTE — Assessment & Plan Note (Signed)
Benign-appearing skin nodule to the left lateral posterior scrotum.

## 2023-08-26 LAB — COMPREHENSIVE METABOLIC PANEL
ALT: 17 U/L (ref 0–53)
AST: 15 U/L (ref 0–37)
Albumin: 4.1 g/dL (ref 3.5–5.2)
Alkaline Phosphatase: 85 U/L (ref 39–117)
BUN: 19 mg/dL (ref 6–23)
CO2: 29 meq/L (ref 19–32)
Calcium: 9 mg/dL (ref 8.4–10.5)
Chloride: 106 meq/L (ref 96–112)
Creatinine, Ser: 1.48 mg/dL (ref 0.40–1.50)
GFR: 52.43 mL/min — ABNORMAL LOW (ref >60.00)
Glucose, Bld: 78 mg/dL (ref 70–99)
Potassium: 4.8 meq/L (ref 3.5–5.1)
Sodium: 142 meq/L (ref 135–145)
Total Bilirubin: 0.3 mg/dL (ref 0.2–1.2)
Total Protein: 6 g/dL (ref 6.0–8.3)

## 2023-08-26 LAB — URINALYSIS, MICROSCOPIC ONLY: RBC / HPF: NONE SEEN (ref 0–?)

## 2023-08-26 LAB — CBC
HCT: 42.9 % (ref 39.0–52.0)
Hemoglobin: 14.2 g/dL (ref 13.0–17.0)
MCHC: 33.2 g/dL (ref 30.0–36.0)
MCV: 91.3 fL (ref 78.0–100.0)
Platelets: 279 10*3/uL (ref 150.0–400.0)
RBC: 4.7 Mil/uL (ref 4.22–5.81)
RDW: 12.8 % (ref 11.5–15.5)
WBC: 9.2 10*3/uL (ref 4.0–10.5)

## 2023-08-26 LAB — LDL CHOLESTEROL, DIRECT: Direct LDL: 53 mg/dL

## 2023-08-26 LAB — LIPID PANEL
Cholesterol: 132 mg/dL (ref 0–200)
HDL: 33.5 mg/dL — ABNORMAL LOW (ref >39.00)
Total CHOL/HDL Ratio: 4
Triglycerides: 487 mg/dL — ABNORMAL HIGH (ref 0.0–149.0)

## 2023-08-26 LAB — HEMOGLOBIN A1C: Hgb A1c MFr Bld: 5.5 % (ref 4.6–6.5)

## 2023-08-26 LAB — TSH: TSH: 0.84 u[IU]/mL (ref 0.35–5.50)

## 2023-08-26 LAB — PSA: PSA: 0.62 ng/mL (ref 0.10–4.00)

## 2023-10-08 ENCOUNTER — Emergency Department (HOSPITAL_COMMUNITY)
Admission: EM | Admit: 2023-10-08 | Discharge: 2023-10-08 | Disposition: A | Discharge status: Home / Self Care | Payer: 59 | Attending: Emergency Medicine | Admitting: Emergency Medicine

## 2023-10-08 ENCOUNTER — Encounter (HOSPITAL_COMMUNITY): Payer: Self-pay

## 2023-10-08 ENCOUNTER — Emergency Department (HOSPITAL_COMMUNITY): Payer: 59

## 2023-10-08 ENCOUNTER — Other Ambulatory Visit: Payer: Self-pay

## 2023-10-08 DIAGNOSIS — Z041 Encounter for examination and observation following transport accident: Secondary | ICD-10-CM | POA: Diagnosis not present

## 2023-10-08 DIAGNOSIS — R519 Headache, unspecified: Secondary | ICD-10-CM | POA: Diagnosis not present

## 2023-10-08 DIAGNOSIS — Z7982 Long term (current) use of aspirin: Secondary | ICD-10-CM | POA: Diagnosis not present

## 2023-10-08 DIAGNOSIS — M546 Pain in thoracic spine: Secondary | ICD-10-CM | POA: Diagnosis not present

## 2023-10-08 DIAGNOSIS — M542 Cervicalgia: Secondary | ICD-10-CM | POA: Insufficient documentation

## 2023-10-08 DIAGNOSIS — M4804 Spinal stenosis, thoracic region: Secondary | ICD-10-CM | POA: Diagnosis not present

## 2023-10-08 DIAGNOSIS — M79672 Pain in left foot: Secondary | ICD-10-CM | POA: Diagnosis not present

## 2023-10-08 DIAGNOSIS — M25572 Pain in left ankle and joints of left foot: Secondary | ICD-10-CM | POA: Diagnosis not present

## 2023-10-08 DIAGNOSIS — M5136 Other intervertebral disc degeneration, lumbar region with discogenic back pain only: Secondary | ICD-10-CM | POA: Diagnosis not present

## 2023-10-08 DIAGNOSIS — M5030 Other cervical disc degeneration, unspecified cervical region: Secondary | ICD-10-CM | POA: Diagnosis not present

## 2023-10-08 DIAGNOSIS — M4802 Spinal stenosis, cervical region: Secondary | ICD-10-CM | POA: Diagnosis not present

## 2023-10-08 DIAGNOSIS — M47816 Spondylosis without myelopathy or radiculopathy, lumbar region: Secondary | ICD-10-CM | POA: Diagnosis not present

## 2023-10-08 DIAGNOSIS — M5134 Other intervertebral disc degeneration, thoracic region: Secondary | ICD-10-CM | POA: Diagnosis not present

## 2023-10-08 DIAGNOSIS — Y92410 Unspecified street and highway as the place of occurrence of the external cause: Secondary | ICD-10-CM | POA: Insufficient documentation

## 2023-10-08 DIAGNOSIS — M545 Low back pain, unspecified: Secondary | ICD-10-CM

## 2023-10-08 DIAGNOSIS — M47817 Spondylosis without myelopathy or radiculopathy, lumbosacral region: Secondary | ICD-10-CM | POA: Diagnosis not present

## 2023-10-08 DIAGNOSIS — Z79899 Other long term (current) drug therapy: Secondary | ICD-10-CM | POA: Insufficient documentation

## 2023-10-08 DIAGNOSIS — J432 Centrilobular emphysema: Secondary | ICD-10-CM | POA: Diagnosis not present

## 2023-10-08 MED ORDER — METHOCARBAMOL 500 MG PO TABS
1000.0000 mg | ORAL_TABLET | Freq: Once | ORAL | Status: AC
  Administered 2023-10-08: 1000 mg via ORAL
  Filled 2023-10-08: qty 2

## 2023-10-08 MED ORDER — ACETAMINOPHEN 325 MG PO TABS
650.0000 mg | ORAL_TABLET | Freq: Once | ORAL | Status: AC
  Administered 2023-10-08: 650 mg via ORAL
  Filled 2023-10-08: qty 2

## 2023-10-08 MED ORDER — METHOCARBAMOL 500 MG PO TABS: 1000.0000 mg | ORAL_TABLET | Freq: Three times a day (TID) | ORAL | 0 refills | Status: DC | PRN
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
## 2023-10-08 NOTE — Progress Notes (Signed)
Orthopedic Tech Progress Note Patient Details:  [name] 10/04/1966 [id number]  Ortho Devices Type of Ortho Device: ASO Ortho Device/Splint Location: Left ankle Ortho Device/Splint Interventions: Application   Post Interventions Patient Tolerated: Well  [name] 10/08/2023, 2:02 PM
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
## 2023-10-08 NOTE — ED Notes (Signed)
Pt d/c home with visitor per EDP order. Discharge summary reviewed, pt verbalizes understanding. Ambulatory off unit. NAD
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
## 2023-10-08 NOTE — ED Notes (Signed)
Pt transported to imaging.

## 2023-10-20 ENCOUNTER — Other Ambulatory Visit: Payer: Self-pay | Admitting: Cardiology

## 2023-10-20 DIAGNOSIS — I2510 Atherosclerotic heart disease of native coronary artery without angina pectoris: Secondary | ICD-10-CM

## 2023-10-24 ENCOUNTER — Ambulatory Visit (INDEPENDENT_AMBULATORY_CARE_PROVIDER_SITE_OTHER): Payer: 59 | Admitting: Nurse Practitioner

## 2023-10-24 ENCOUNTER — Encounter: Payer: Self-pay | Admitting: Nurse Practitioner

## 2023-10-24 VITALS — BP 122/80 | HR 68 | Temp 98.0°F | Ht 69.0 in | Wt 230.8 lb

## 2023-10-24 DIAGNOSIS — N289 Disorder of kidney and ureter, unspecified: Secondary | ICD-10-CM | POA: Diagnosis not present

## 2023-10-24 DIAGNOSIS — Z0000 Encounter for general adult medical examination without abnormal findings: Secondary | ICD-10-CM

## 2023-10-24 DIAGNOSIS — Z1211 Encounter for screening for malignant neoplasm of colon: Secondary | ICD-10-CM

## 2023-10-24 DIAGNOSIS — I2510 Atherosclerotic heart disease of native coronary artery without angina pectoris: Secondary | ICD-10-CM

## 2023-10-24 DIAGNOSIS — Z23 Encounter for immunization: Secondary | ICD-10-CM | POA: Diagnosis not present

## 2023-10-24 DIAGNOSIS — F17200 Nicotine dependence, unspecified, uncomplicated: Secondary | ICD-10-CM | POA: Diagnosis not present

## 2023-10-24 DIAGNOSIS — Z122 Encounter for screening for malignant neoplasm of respiratory organs: Secondary | ICD-10-CM

## 2023-10-24 MED ORDER — METHOCARBAMOL 500 MG PO TABS: 500.0000 mg | ORAL_TABLET | Freq: Three times a day (TID) | ORAL | 0 refills | Status: DC | PRN

## 2023-10-24 NOTE — Progress Notes (Signed)
Established Patient Office Visit  Subjective   Patient ID: [name], male    DOB: [birth year]  Age: 57 y.o. MRN: [id number]  Chief Complaint  Patient presents with   Annual Exam    Tdap and Shingles vaccine. Pt states he needs refill for robaxin      HPI  for complete physical and follow up of chronic conditions.   CAD: once a year  with cardiology. He is on atorvastatin 40mg   ED: viagra as needed. States that he has trouble getting the erection  HLD: on atorvastatin 40mg  daily. He does have a stent. Follows up with cardiology once a year    Immunizations: -Tetanus: Completed in today -Influenza: refused  -Shingles: deferred  -Pneumonia: too young  Diet: Fair diet. He is eating 2-3 meals a day. No snacks. Water  Exercise: No regular exercise.  Eye exam: PRN Dental exam: Upper and lower    Colonoscopy: amb to [place]  Lung Cancer Screening: order placed today   PSA: Done  08/25/2023 that was normal  Sleep: goes to bed around 9 and get up around 4. Feels rested. Does snore         Review of Systems  Constitutional:  Negative for chills and fever.  Respiratory:  Negative for shortness of breath.   Cardiovascular:  Negative for chest pain and leg swelling.  Gastrointestinal:  Negative for abdominal pain, blood in stool, constipation, diarrhea, nausea and vomiting.       BM daily   Genitourinary:  Negative for dysuria and hematuria.  Neurological:  Negative for tingling and headaches.  Psychiatric/Behavioral:  Negative for hallucinations and suicidal ideas.       Objective:     BP 122/80   Pulse 68   Temp 98 F (36.7 C) (Oral)   Ht 5\' 9"  (1.753 m)   Wt 230 lb 12.8 oz (104.7 kg)   SpO2 93%   BMI 34.08 kg/m  BP Readings from Last 3 Encounters:  10/24/23 122/80  10/08/23 123/88  08/25/23 110/68   Wt Readings from Last 3 Encounters:  10/24/23 230 lb 12.8 oz (104.7 kg)  10/08/23 234 lb (106.1 kg)  08/25/23 234 lb (106.1 kg)   SpO2  Readings from Last 3 Encounters:  10/24/23 93%  10/08/23 100%  08/25/23 94%      Physical Exam Vitals and nursing note reviewed.  Constitutional:      Appearance: Normal appearance.  HENT:     Right Ear: Tympanic membrane, ear canal and external ear normal.     Left Ear: Tympanic membrane, ear canal and external ear normal.     Mouth/Throat:     Mouth: Mucous membranes are moist.     Pharynx: Oropharynx is clear.  Eyes:     Extraocular Movements: Extraocular movements intact.     Pupils: Pupils are equal, round, and reactive to light.  Cardiovascular:     Rate and Rhythm: Normal rate and regular rhythm.     Pulses: Normal pulses.     Heart sounds: Normal heart sounds.  Pulmonary:     Effort: Pulmonary effort is normal.     Breath sounds: Normal breath sounds.  Abdominal:     General: Bowel sounds are normal. There is no distension.     Palpations: There is no mass.     Tenderness: There is no abdominal tenderness.     Hernia: No hernia is present.  Musculoskeletal:     Right lower leg: No edema.  Left lower leg: No edema.  Lymphadenopathy:     Cervical: No cervical adenopathy.  Skin:    General: Skin is warm.  Neurological:     General: No focal deficit present.     Mental Status: He is alert.     Deep Tendon Reflexes:     Reflex Scores:      Bicep reflexes are 2+ on the right side and 2+ on the left side.      Patellar reflexes are 2+ on the right side and 2+ on the left side.    Comments: Bilateral upper and lower extremity strength 5/5  Psychiatric:        Mood and Affect: Mood normal.        Behavior: Behavior normal.        Thought Content: Thought content normal.        Judgment: Judgment normal.      No results found for any visits on 10/24/23.    The 10-year ASCVD risk score (Arnett DK, et al., 2019) is: 9.6%    Assessment & Plan:   Problem List Items Addressed This Visit       Cardiovascular and Mediastinum   CAD (coronary artery  disease)   Patient is followed by cardiology.  On atorvastatin 40mg  daily. Continue         Other   TOBACCO ABUSE   History of the same.  Checked urine microscopy last visit.  Patient states he is quitting tomorrow      Preventative health care - Primary   Discussed age-appropriate immunizations and screening exams.  Did review patient's personal, surgical, social, family histories.  Patient is up-to-date on all age-appropriate vaccinations he would like.  Update Tdap today patient deferred shingles vaccine currently.  Ambulatory referral to gastroenterology for CRC screening.  PSA is up-to-date.  Referral for lung cancer screening made today.  Patient was given permission at discharge about preventative healthcare maintenance with anticipatory guidance      Decreased renal function   Pending BMP today      Relevant Orders   Basic metabolic panel   Other Visit Diagnoses       Need for Tdap vaccination       Relevant Orders   Tdap vaccine greater than or equal to 7yo IM     Screening for colon cancer       Relevant Orders   Ambulatory referral to Gastroenterology     Screening for lung cancer       Relevant Orders   Ambulatory Referral Lung Cancer Screening [facility]       Return in about 1 year (around 10/23/2024) for CPE and Labs.    Audria Nine, NP

## 2023-10-24 NOTE — Assessment & Plan Note (Signed)
Discussed age-appropriate immunizations and screening exams.  Did review patient's personal, surgical, social, family histories.  Patient is up-to-date on all age-appropriate vaccinations he would like.  Update Tdap today patient deferred shingles vaccine currently.  Ambulatory referral to gastroenterology for CRC screening.  PSA is up-to-date.  Referral for lung cancer screening made today.  Patient was given permission at discharge about preventative healthcare maintenance with anticipatory guidance

## 2023-10-24 NOTE — Patient Instructions (Signed)
Nice to see you today I will be in touch with the labs once I have reviewed them We updated your tetanus vaccine today Follow up with me in 1 year, sooner if you need me

## 2023-10-24 NOTE — Assessment & Plan Note (Signed)
History of the same.  Checked urine microscopy last visit.  Patient states he is quitting tomorrow

## 2023-10-24 NOTE — Assessment & Plan Note (Signed)
Patient is followed by cardiology.  On atorvastatin 40mg  daily. Continue

## 2023-10-24 NOTE — Assessment & Plan Note (Signed)
Pending BMP today

## 2023-10-25 LAB — BASIC METABOLIC PANEL
BUN: 16 mg/dL (ref 6–23)
CO2: 27 meq/L (ref 19–32)
Calcium: 9.2 mg/dL (ref 8.4–10.5)
Chloride: 102 meq/L (ref 96–112)
Creatinine, Ser: 0.92 mg/dL (ref 0.40–1.50)
GFR: 92.65 mL/min (ref >60.00)
Glucose, Bld: 83 mg/dL (ref 70–99)
Potassium: 4.1 meq/L (ref 3.5–5.1)
Sodium: 137 meq/L (ref 135–145)

## 2023-11-16 ENCOUNTER — Ambulatory Visit (INDEPENDENT_AMBULATORY_CARE_PROVIDER_SITE_OTHER): Payer: 59 | Admitting: Nurse Practitioner

## 2023-11-16 ENCOUNTER — Encounter: Payer: Self-pay | Admitting: Nurse Practitioner

## 2023-11-16 VITALS — BP 110/80 | HR 75 | Temp 98.5°F | Ht 69.0 in | Wt 227.0 lb

## 2023-11-16 DIAGNOSIS — J3489 Other specified disorders of nose and nasal sinuses: Secondary | ICD-10-CM | POA: Insufficient documentation

## 2023-11-16 DIAGNOSIS — B349 Viral infection, unspecified: Secondary | ICD-10-CM | POA: Diagnosis not present

## 2023-11-16 DIAGNOSIS — R52 Pain, unspecified: Secondary | ICD-10-CM | POA: Diagnosis not present

## 2023-11-16 DIAGNOSIS — R519 Headache, unspecified: Secondary | ICD-10-CM | POA: Diagnosis not present

## 2023-11-16 DIAGNOSIS — R197 Diarrhea, unspecified: Secondary | ICD-10-CM

## 2023-11-16 LAB — POCT FLU A/B STATUS
Influenza A, POC: NEGATIVE
Influenza B, POC: NEGATIVE

## 2023-11-16 LAB — POC COVID19 BINAXNOW: SARS Coronavirus 2 Ag: NEGATIVE

## 2023-11-16 MED ORDER — FLUTICASONE PROPIONATE 50 MCG/ACT NA SUSP: 2.0000 | Freq: Every day | NASAL | 0 refills | Status: DC

## 2023-11-16 NOTE — Assessment & Plan Note (Addendum)
COVID and Flu testing negative.  OTC analgesics as directed. Flonase 50 mcg/act, 1-2 sprays in each nostril daily.

## 2023-11-16 NOTE — Assessment & Plan Note (Addendum)
COVID and Flu testing negative. OTC analgesics as directed.

## 2023-11-16 NOTE — Progress Notes (Addendum)
Acute Office Visit  Subjective:     Patient ID: [name], male    DOB: 11-Dec-1965, 58 y.o.   MRN: [id number]  Chief Complaint  Patient presents with   Sinus Problem    Pt complains of chills, body aches, sinus pressure, and diarrhea that started yesterday. No coughing or sore throat.     HPI Patient is in today for sinus pressure, body aches, HA, abd cramping, and diarrhea that started 2 days ago. Pt denies fever, chills, sore throat, cough or ShOB. Pt has thick yellow nasal drainage. Diarrhea is characterized as mixed loose with formed with a frequency of 3 times a day associated with meals and denies hematochezia. Pt reports a good appetite,  but is eating less to limit episodes of diarrhea. Pt drinking 4-5 bottles of water and ginger ale per day. Pt has been taking Mucinex and Corcedin with some relief.   Review of Systems  Constitutional:  Negative for chills, fever and weight loss.  HENT:  Positive for congestion and sinus pain. Negative for ear pain.   Eyes:  Negative for blurred vision.  Respiratory:  Negative for cough and shortness of breath.   Cardiovascular:  Negative for chest pain and palpitations.  Gastrointestinal:  Positive for abdominal pain and diarrhea. Negative for blood in stool, nausea and vomiting.  Genitourinary:  Negative for dysuria.  Musculoskeletal:  Positive for myalgias.  Skin:  Negative for rash.  Neurological:  Positive for headaches.        Objective:    BP 110/80   Pulse 75   Temp 98.5 F (36.9 C) (Oral)   Ht 5\' 9"  (1.753 m)   Wt 103 kg   SpO2 98%   BMI 33.52 kg/m    Physical Exam Constitutional:      Appearance: Normal appearance.  HENT:     Right Ear: Tympanic membrane, ear canal and external ear normal.     Left Ear: Tympanic membrane, ear canal and external ear normal.     Nose: Congestion and rhinorrhea present.     Right Sinus: No maxillary sinus tenderness or frontal sinus tenderness.     Left Sinus: No maxillary  sinus tenderness or frontal sinus tenderness.     Mouth/Throat:     Mouth: Mucous membranes are moist.     Pharynx: Oropharynx is clear.  Eyes:     Pupils: Pupils are equal, round, and reactive to light.  Cardiovascular:     Rate and Rhythm: Normal rate and regular rhythm.     Pulses: Normal pulses.     Heart sounds: Normal heart sounds. No murmur heard.    No friction rub. No gallop.  Pulmonary:     Effort: Pulmonary effort is normal.     Breath sounds: Normal breath sounds.  Abdominal:     General: Bowel sounds are normal.     Palpations: Abdomen is soft.     Tenderness: There is no abdominal tenderness. There is no guarding.  Musculoskeletal:     Cervical back: Neck supple. No tenderness.  Lymphadenopathy:     Cervical: No cervical adenopathy.  Skin:    General: Skin is warm and dry.  Neurological:     Mental Status: He is alert.     Results for orders placed or performed in visit on 11/16/23  POC COVID-19  Result Value Ref Range   SARS Coronavirus 2 Ag Negative Negative  POCT Flu A & B Status  Result Value Ref Range  Influenza A, POC Negative Negative   Influenza B, POC Negative Negative        Assessment & Plan:   Problem List Items Addressed This Visit       Digestive   Diarrhea of presumed infectious origin   Continue eat as tolerated. Stay hydrated with at least 64 oz of water per day.       Relevant Orders   POC COVID-19 (Completed)   POCT Flu A & B Status (Completed)     Other   Viral illness - Primary   COVID and Flu testing negative.  OTC analgesics as directed. Flonase 50 mcg/act, 1-2 sprays in each nostril daily.       Relevant Medications   fluticasone (FLONASE) 50 MCG/ACT nasal spray   Body aches   COVID and Flu testing negative. OTC analgesics as directed.       Relevant Orders   POC COVID-19 (Completed)   POCT Flu A & B Status (Completed)   Sinus pressure   COVID and Flu testing negative Flonase 50 mcg/act, 1-2 sprays per  nostril daily.       Relevant Medications   fluticasone (FLONASE) 50 MCG/ACT nasal spray   Other Relevant Orders   POC COVID-19 (Completed)   POCT Flu A & B Status (Completed)   Acute nonintractable headache   COVID and Flu testing negative.  OTC analgesics as directed. Encouraged adequate hydration.       Relevant Orders   POC COVID-19 (Completed)   POCT Flu A & B Status (Completed)    Meds ordered this encounter  Medications   fluticasone (FLONASE) 50 MCG/ACT nasal spray    Sig: Place 2 sprays into both nostrils daily.    Dispense:  16 g    Refill:  0    Return if symptoms worsen or fail to improve.  Murvin Donning, RN

## 2023-11-16 NOTE — Patient Instructions (Signed)
It was a pleasure to see you today.  Your prescription has been sent to the pharmacy.  Please use Tylenol and/or ibuprofen to help with the body aches.  Continue the Mucinex and Corcidin as the box directs.  Follow-up in 5-6 days if symptoms are not improving or sooner if you develop shortness of breath, or chest pain.

## 2023-11-16 NOTE — Assessment & Plan Note (Signed)
Continue eat as tolerated. Stay hydrated with at least 64 oz of water per day.

## 2023-11-16 NOTE — Assessment & Plan Note (Addendum)
COVID and Flu testing negative.  OTC analgesics as directed. Encouraged adequate hydration.

## 2023-11-16 NOTE — Assessment & Plan Note (Addendum)
COVID and Flu testing negative Flonase 50 mcg/act, 1-2 sprays per nostril daily.

## 2023-12-02 ENCOUNTER — Other Ambulatory Visit: Payer: Self-pay | Admitting: Nurse Practitioner

## 2023-12-02 MED ORDER — METHOCARBAMOL 500 MG PO TABS: 500.0000 mg | ORAL_TABLET | Freq: Three times a day (TID) | ORAL | 0 refills | Status: DC | PRN

## 2023-12-02 NOTE — Telephone Encounter (Signed)
 Copied from CRM [id number]. Topic: Clinical - Medication Refill >> Dec 02, 2023  8:39 AM Leila C wrote: Most Recent Primary Care Visit:  Provider: WENDEE LYNWOOD HERO  Department: [facility]  Visit Type: ACUTE  Date: 11/16/2023  Medication: methocarbamol  (ROBAXIN ) 500 MG tablet  Has the patient contacted their pharmacy? No (Agent: If no, request that the patient contact the pharmacy for the refill. If patient does not wish to contact the pharmacy document the reason why and proceed with request.) (Agent: If yes, when and what did the pharmacy advise?)  Is this the correct pharmacy for this prescription? Yes If no, delete pharmacy and type the correct one.  This is the patient's preferred pharmacy:  [facility] [address], [place] - [address] [place] [address] Phone: [phone] Fax: [phone]   Has the prescription been filled recently? No  Is the patient out of the medication? Yes  Has the patient been seen for an appointment in the last year OR does the patient have an upcoming appointment? Yes  Can we respond through MyChart? No, please call [phone]  Agent: Please be advised that Rx refills may take up to 3 business days. We ask that you follow-up with your pharmacy.

## 2023-12-12 ENCOUNTER — Ambulatory Visit (INDEPENDENT_AMBULATORY_CARE_PROVIDER_SITE_OTHER): Payer: 59 | Admitting: Nurse Practitioner

## 2023-12-12 ENCOUNTER — Ambulatory Visit (INDEPENDENT_AMBULATORY_CARE_PROVIDER_SITE_OTHER)
Admission: RE | Admit: 2023-12-12 | Discharge: 2023-12-12 | Disposition: A | Discharge status: Home / Self Care | Payer: 59 | Source: Ambulatory Visit | Attending: Nurse Practitioner | Admitting: Nurse Practitioner

## 2023-12-12 VITALS — BP 116/60 | HR 72 | Temp 98.3°F | Ht 69.0 in | Wt 216.6 lb

## 2023-12-12 DIAGNOSIS — R062 Wheezing: Secondary | ICD-10-CM

## 2023-12-12 DIAGNOSIS — J0140 Acute pansinusitis, unspecified: Secondary | ICD-10-CM | POA: Diagnosis not present

## 2023-12-12 DIAGNOSIS — A09 Infectious gastroenteritis and colitis, unspecified: Secondary | ICD-10-CM | POA: Diagnosis not present

## 2023-12-12 DIAGNOSIS — R051 Acute cough: Secondary | ICD-10-CM

## 2023-12-12 DIAGNOSIS — R509 Fever, unspecified: Secondary | ICD-10-CM | POA: Insufficient documentation

## 2023-12-12 DIAGNOSIS — R52 Pain, unspecified: Secondary | ICD-10-CM | POA: Diagnosis not present

## 2023-12-12 LAB — POCT FLU A/B STATUS
Influenza A, POC: NEGATIVE
Influenza B, POC: NEGATIVE

## 2023-12-12 LAB — POC COVID19 BINAXNOW: SARS Coronavirus 2 Ag: NEGATIVE

## 2023-12-12 MED ORDER — BENZONATATE 200 MG PO CAPS: 200.0000 mg | ORAL_CAPSULE | Freq: Three times a day (TID) | ORAL | 0 refills | Status: DC | PRN

## 2023-12-12 MED ORDER — DOXYCYCLINE HYCLATE 100 MG PO TABS: 100.0000 mg | ORAL_TABLET | Freq: Two times a day (BID) | ORAL | 0 refills | Status: AC

## 2023-12-12 MED ORDER — PREDNISONE 20 MG PO TABS: 40.0000 mg | ORAL_TABLET | Freq: Every day | ORAL | 0 refills | Status: AC

## 2023-12-12 NOTE — Patient Instructions (Addendum)
Nice to see you today I will be in touch with the xray once I have it Follow up with me if you do not improve   Flu and covid test was negative in office

## 2023-12-12 NOTE — Progress Notes (Signed)
Acute Office Visit  Subjective:     Patient ID: [name], male    DOB: 12-29-1965, 58 y.o.   MRN: [id number]  Chief Complaint  Patient presents with   URI    Pt complains of cough, diarrhea, chills and body aches that started Thursday night.      Patient is in today for sick symptoms with a history of  CAD, HLD, obesty   Symptoms started on Thursday night Covid vaccine: not up to date Flu vaccine: not up to date  States that he has been around a coworker and has been visits a family member in the hospital   He has been taking coricidn that helps for a short peroid  2-3 times a day with diarrhea    Review of Systems  Constitutional:  Positive for chills, fever and malaise/fatigue.  HENT:  Negative for ear discharge, ear pain and sore throat.   Respiratory:  Positive for cough and sputum production (colored and thick). Negative for shortness of breath.   Gastrointestinal:  Positive for diarrhea. Negative for abdominal pain, nausea and vomiting.  Musculoskeletal:  Positive for myalgias.  Neurological:  Positive for headaches.        Objective:    BP 116/60   Pulse 72   Temp 98.3 F (36.8 C) (Oral)   Ht 5\' 9"  (1.753 m)   Wt 216 lb 9.6 oz (98.2 kg)   SpO2 91%   BMI 31.99 kg/m  BP Readings from Last 3 Encounters:  12/12/23 116/60  11/16/23 110/80  10/24/23 122/80   Wt Readings from Last 3 Encounters:  12/12/23 216 lb 9.6 oz (98.2 kg)  11/16/23 227 lb (103 kg)  10/24/23 230 lb 12.8 oz (104.7 kg)   SpO2 Readings from Last 3 Encounters:  12/12/23 91%  11/16/23 98%  10/24/23 93%      Physical Exam Vitals and nursing note reviewed.  Constitutional:      Appearance: Normal appearance.  HENT:     Right Ear: Tympanic membrane, ear canal and external ear normal.     Left Ear: Tympanic membrane, ear canal and external ear normal.     Nose:     Right Sinus: Maxillary sinus tenderness and frontal sinus tenderness present.     Left Sinus: Maxillary  sinus tenderness and frontal sinus tenderness present.     Mouth/Throat:     Mouth: Mucous membranes are moist.     Pharynx: Oropharynx is clear.  Cardiovascular:     Rate and Rhythm: Normal rate and regular rhythm.     Heart sounds: Normal heart sounds.  Pulmonary:     Effort: Pulmonary effort is normal.     Comments: Decreased globally with rhonchi and wheezing Lymphadenopathy:     Cervical: No cervical adenopathy.  Neurological:     Mental Status: He is alert.     Results for orders placed or performed in visit on 12/12/23  POC COVID-19  Result Value Ref Range   SARS Coronavirus 2 Ag Negative Negative  POCT Flu A & B Status  Result Value Ref Range   Influenza A, POC Negative Negative   Influenza B, POC Negative Negative        Assessment & Plan:   Problem List Items Addressed This Visit       Respiratory   Acute non-recurrent pansinusitis   Will treat with doxycycline 100 mg twice daily for 7 days.  Flu and COVID test negative in office  Relevant Medications   doxycycline (VIBRA-TABS) 100 MG tablet   benzonatate (TESSALON) 200 MG capsule   predniSONE (DELTASONE) 20 MG tablet     Digestive   Diarrhea of infectious origin   Flu and COVID test negative in office.  Stay hydrated      Relevant Orders   POCT Flu A & B Status (Completed)     Other   Body aches   Flu and COVID test in office.  Over-the-counter analgesics as needed      Relevant Orders   POC COVID-19 (Completed)   POCT Flu A & B Status (Completed)   Acute cough - Primary   Pending chest x-ray today.  Flu and COVID test in office.  Tessalon Perles 20 mg 3 times daily as needed      Relevant Medications   benzonatate (TESSALON) 200 MG capsule   Other Relevant Orders   DG Chest 2 View   POC COVID-19 (Completed)   POCT Flu A & B Status (Completed)   Wheezing   Pending chest x-ray in the setting of poor smoker prednisone 40 mg daily.  Prednisone precautions discussed      Relevant  Medications   predniSONE (DELTASONE) 20 MG tablet   Other Relevant Orders   DG Chest 2 View   Fever and chills   Flu and COVID test in office.  Over-the-counter analgesics as needed      Relevant Orders   POC COVID-19 (Completed)   POCT Flu A & B Status (Completed)    Meds ordered this encounter  Medications   doxycycline (VIBRA-TABS) 100 MG tablet    Sig: Take 1 tablet (100 mg total) by mouth 2 (two) times daily for 7 days.    Dispense:  14 tablet    Refill:  0    Supervising Provider:   Roxy Manns A [[id number]]   benzonatate (TESSALON) 200 MG capsule    Sig: Take 1 capsule (200 mg total) by mouth 3 (three) times daily as needed for cough.    Dispense:  21 capsule    Refill:  0    Supervising Provider:   Roxy Manns A [[id number]]   predniSONE (DELTASONE) 20 MG tablet    Sig: Take 2 tablets (40 mg total) by mouth daily with breakfast for 5 days.    Dispense:  10 tablet    Refill:  0    Avoid NSAIDs    Supervising Provider:   Roxy Manns A [[id number]]    Return if symptoms worsen or fail to improve.  Audria Nine, NP

## 2023-12-12 NOTE — Assessment & Plan Note (Signed)
Flu and COVID test in office.  Over-the-counter analgesics as needed

## 2023-12-12 NOTE — Assessment & Plan Note (Signed)
Flu and COVID test negative in office.  Stay hydrated

## 2023-12-12 NOTE — Assessment & Plan Note (Signed)
Pending chest x-ray in the setting of poor smoker prednisone 40 mg daily.  Prednisone precautions discussed

## 2023-12-12 NOTE — Assessment & Plan Note (Signed)
Will treat with doxycycline 100 mg twice daily for 7 days.  Flu and COVID test negative in office

## 2023-12-12 NOTE — Assessment & Plan Note (Signed)
Pending chest x-ray today.  Flu and COVID test in office.  Tessalon Perles 20 mg 3 times daily as needed

## 2023-12-15 ENCOUNTER — Telehealth: Payer: Self-pay | Admitting: *Deleted

## 2023-12-15 NOTE — Telephone Encounter (Signed)
Copied from CRM [id number]. Topic: Clinical - Lab/Test Results >> Dec 15, 2023  4:18 PM Chantha C wrote: Reason for CRM: Patient would like imaging results, please call back at [phone].

## 2023-12-16 NOTE — Telephone Encounter (Signed)
I have sent a results note message earlier today

## 2023-12-18 ENCOUNTER — Telehealth: Payer: Self-pay | Admitting: Nurse Practitioner

## 2023-12-18 NOTE — Telephone Encounter (Signed)
-----   Message from [facility] T sent at 12/16/2023 10:04 AM EST ----- Called patient reviewed all information and repeated back to me. Pt states he does see some improvement but says its not completely over yet.  Pt complains of only having 1 tablet of Prednisone left.

## 2023-12-18 NOTE — Telephone Encounter (Signed)
 I only gave him enough prednisone for 5 days. If he is improving and breathing ok then we will not add any more prednisone

## 2023-12-19 NOTE — Telephone Encounter (Signed)
 Called patient he is continued to have improvement in symptoms. He has one more day of the antibiotic. He will continue all medications as directed. If symptoms don't continue to improve or any new symptoms occur he will reach out to our office.

## 2024-01-23 ENCOUNTER — Other Ambulatory Visit: Payer: Self-pay | Admitting: Nurse Practitioner

## 2024-01-26 ENCOUNTER — Other Ambulatory Visit: Payer: Self-pay | Admitting: Nurse Practitioner

## 2024-01-26 MED ORDER — METHOCARBAMOL 500 MG PO TABS: 500.0000 mg | ORAL_TABLET | Freq: Three times a day (TID) | ORAL | 0 refills | Status: DC | PRN

## 2024-01-26 NOTE — Telephone Encounter (Signed)
 Copied from CRM [id number]. Topic: Clinical - Medication Refill >> Jan 26, 2024  7:49 AM Pascal Lux wrote: Most Recent Primary Care Visit:  Provider: Eden Emms  Department: [facility]  Visit Type: ACUTE  Date: 12/12/2023  Medication: methocarbamol (ROBAXIN) 500 MG tablet [[id number]]  Has the patient contacted their pharmacy? Yes (Agent: If no, request that the patient contact the pharmacy for the refill. If patient does not wish to contact the pharmacy document the reason why and proceed with request.) (Agent: If yes, when and what did the pharmacy advise?) Pharmacy sent a request to provider  Is this the correct pharmacy for this prescription? Yes If no, delete pharmacy and type the correct one.  This is the patient's preferred pharmacy:  [facility] [address], [place] - [address] [place] [address] Phone: [phone] Fax: [phone]   Has the prescription been filled recently? Yes  Is the patient out of the medication? Yes  Has the patient been seen for an appointment in the last year OR does the patient have an upcoming appointment? Yes  Can we respond through MyChart? No  Agent: Please be advised that Rx refills may take up to 3 business days. We ask that you follow-up with your pharmacy.

## 2024-05-09 ENCOUNTER — Ambulatory Visit: Payer: Self-pay

## 2024-05-09 NOTE — Telephone Encounter (Signed)
 FYI Only or Action Required?: FYI only for provider.  Patient was last seen in primary care on 12/12/2023 by Wendee Lynwood HERO, NP.  Called Nurse Triage reporting Groin Pain.  Symptoms began yesterday.  Interventions attempted: Nothing.  Symptoms are: gradually worsening.  Triage Disposition: See Physician Within 24 Hours  Patient/caregiver understands and will follow disposition?: Yes  Copied from CRM [id number]. Topic: Clinical - Red Word Triage >> May 09, 2024 11:35 AM Marissa P wrote: Red Word that prompted transfer to Nurse Triage: Patient believes he re injured his groin area pain there, had a surgery last year for the hernia. Needs to get this checked right away please Reason for Disposition  Large swelling or bruise (> 2 inches or 5 cm)  Answer Assessment - Initial Assessment Questions 1. MECHANISM: How did the injury happen? (e.g., twisting injury, direct blow)      Strain 2. ONSET: When did the injury happen? (e.g., minutes, hours ago)      Last night 3. LOCATION: Where is the injury located?      groin 4. APPEARANCE of INJURY: What does the injury look like?  (e.g., looks normal; bruise, swelling)     Swollen 5. PAIN: Is there pain? If Yes, ask: How bad is the pain?   What does it keep you from doing? (Scale 0-10; or none, mild, moderate, severe)     4/10 6. SIZE: For cuts, bruises, or swelling, ask: How large is it? (e.g., inches or centimeters;  entire joint)      Denies 7. TETANUS: For any breaks in the skin, ask: When was your last tetanus booster?     Denies 8. OTHER SYMPTOMS: Do you have any other symptoms?      Increased urge to urinate  Additional info: Pain is located in groin near past hernia repair site, he is unsure if hernia has returned or if he strained the muscle in his groin. Pain started overnight without known injury/strain.  Protocols used: Groin Injury and Strain-A-AH

## 2024-05-09 NOTE — Telephone Encounter (Signed)
 noted

## 2024-05-10 ENCOUNTER — Encounter: Payer: Self-pay | Admitting: Family Medicine

## 2024-05-10 ENCOUNTER — Ambulatory Visit (INDEPENDENT_AMBULATORY_CARE_PROVIDER_SITE_OTHER): Admitting: Family Medicine

## 2024-05-10 VITALS — BP 110/74 | HR 71 | Temp 98.4°F | Ht 69.0 in | Wt 204.5 lb

## 2024-05-10 DIAGNOSIS — K4090 Unilateral inguinal hernia, without obstruction or gangrene, not specified as recurrent: Secondary | ICD-10-CM | POA: Insufficient documentation

## 2024-05-10 DIAGNOSIS — Z9889 Other specified postprocedural states: Secondary | ICD-10-CM | POA: Diagnosis not present

## 2024-05-10 DIAGNOSIS — Z8719 Personal history of other diseases of the digestive system: Secondary | ICD-10-CM | POA: Diagnosis not present

## 2024-05-10 MED ORDER — METHOCARBAMOL 500 MG PO TABS: 500.0000 mg | ORAL_TABLET | Freq: Three times a day (TID) | ORAL | 0 refills | Status: AC | PRN

## 2024-05-10 NOTE — Assessment & Plan Note (Signed)
 Acute, patient with possible direct inguinal hernia, no evidence of incarceration.  No red flags. Encourage patient to limit heavy lifting, no lifting greater than 10 pounds Patient went ahead with history of hernia repair on the left as well.  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 Recommend general surgeon evaluation.  Return and ER precautions provided.

## 2024-05-10 NOTE — Progress Notes (Signed)
 Patient ID: [name], male    DOB: [birth year], 58 y.o.   MRN: [id number]  This visit was conducted in person.  BP 110/74   Pulse 71   Temp 98.4 F (36.9 C) (Temporal)   Ht 5' 9 (1.753 m)   Wt 204 lb 8 oz (92.8 kg)   SpO2 97%   BMI 30.20 kg/m    CC:  Chief Complaint  Patient presents with   Groin Pain    ?Inguinal Hernia    Subjective:   HPI: [name] is a 58 y.o. male  pt of Adina Crandall, NP presenting on 05/10/2024 for Groin Pain (?Inguinal Hernia)   New onset pain in groin x 3 days.. Woke up with  dull pain in left lower groin. He works as a Education administrator.. lift things a lot.  No known injury.  No pain in testicle  Notes swelling in left groin off and on.  No scrotal swelling.  No redness.    NMl BMs.  Urinating   stream inconsistent.   No fever.   History of hernia surgery [id number] on left. 8-10 years ago.      Using methocarbamol  for low back issues... needs refill of robaxin .  Relevant past medical, surgical, family and social history reviewed and updated as indicated. Interim medical history since our last visit reviewed. Allergies and medications reviewed and updated. Outpatient Medications Prior to Visit  Medication Sig Dispense Refill   aspirin  81 MG tablet Take 81 mg by mouth daily.     atorvastatin  (LIPITOR ) 40 MG tablet Take 1 tablet (40 mg total) by mouth daily. 90 tablet 2   fluticasone  (FLONASE ) 50 MCG/ACT nasal spray Place 2 sprays into both nostrils daily. 16 g 0   nitroGLYCERIN  (NITROSTAT ) 0.4 MG SL tablet DISSOLVE ONE TABLET UNDER THE TONGUE EVERY 5 MINUTES AS NEEDED FOR CHEST PAIN.  DO NOT EXCEED A TOTAL OF 3 DOSES IN 15 MINUTES 25 tablet 2   sildenafil  (VIAGRA ) 50 MG tablet Take 0.5-1 tablets (25-50 mg total) by mouth daily as needed for erectile dysfunction. 10 tablet 0   methocarbamol  (ROBAXIN ) 500 MG tablet Take 1 tablet (500 mg total) by mouth 3 (three) times daily as needed for muscle spasms. 30 tablet 0   benzonatate   (TESSALON ) 200 MG capsule Take 1 capsule (200 mg total) by mouth 3 (three) times daily as needed for cough. 21 capsule 0   No facility-administered medications prior to visit.     Per HPI unless specifically indicated in ROS section below Review of Systems  Constitutional:  Negative for fatigue and fever.  HENT:  Negative for ear pain.   Eyes:  Negative for pain.  Respiratory:  Negative for cough and shortness of breath.   Cardiovascular:  Negative for chest pain, palpitations and leg swelling.  Gastrointestinal:  Negative for abdominal pain.  Genitourinary:  Negative for dysuria.  Musculoskeletal:  Negative for arthralgias.  Neurological:  Negative for syncope, light-headedness and headaches.  Psychiatric/Behavioral:  Negative for dysphoric mood.    Objective:  BP 110/74   Pulse 71   Temp 98.4 F (36.9 C) (Temporal)   Ht 5' 9 (1.753 m)   Wt 204 lb 8 oz (92.8 kg)   SpO2 97%   BMI 30.20 kg/m   Wt Readings from Last 3 Encounters:  05/10/24 204 lb 8 oz (92.8 kg)  12/12/23 216 lb 9.6 oz (98.2 kg)  11/16/23 227 lb (103 kg)      Physical Exam Vitals  reviewed. Exam conducted with a chaperone present.  Constitutional:      Appearance: He is well-developed.  HENT:     Head: Normocephalic.     Right Ear: Hearing normal.     Left Ear: Hearing normal.     Nose: Nose normal.  Neck:     Thyroid : No thyroid  mass or thyromegaly.     Vascular: No carotid bruit.     Trachea: Trachea normal.  Cardiovascular:     Rate and Rhythm: Normal rate and regular rhythm.     Pulses: Normal pulses.     Heart sounds: Heart sounds not distant. No murmur heard.    No friction rub. No gallop.     Comments: No peripheral edema Pulmonary:     Effort: Pulmonary effort is normal. No respiratory distress.     Breath sounds: Normal breath sounds.  Abdominal:     Hernia: A hernia is present. Hernia is present in the left inguinal area. There is no hernia in the right inguinal area.  Genitourinary:     Penis: Normal.      Testes: Normal.     Epididymis:     Right: Normal.  Lymphadenopathy:     Lower Body: No right inguinal adenopathy. No left inguinal adenopathy.  Skin:    General: Skin is warm and dry.     Findings: No rash.  Psychiatric:        Speech: Speech normal.        Behavior: Behavior normal.        Thought Content: Thought content normal.       Results for orders placed or performed in visit on 12/12/23  POC COVID-19   Collection Time: 12/12/23 12:48 PM  Result Value Ref Range   SARS Coronavirus 2 Ag Negative Negative  POCT Flu A & B Status   Collection Time: 12/12/23 12:49 PM  Result Value Ref Range   Influenza A, POC Negative Negative   Influenza B, POC Negative Negative    Assessment and Plan  Left inguinal hernia Assessment & Plan: Acute, patient with possible direct inguinal hernia, no evidence of incarceration.  No red flags. Encourage patient to limit heavy lifting, no lifting greater than 10 pounds Patient went ahead with history of hernia repair on the left as well.  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 Recommend general surgeon evaluation.  Return and ER precautions provided.  Orders: -     Ambulatory referral to General Surgery  History of hernia repair -     Ambulatory referral to General Surgery  Other orders -     Methocarbamol ; Take 1 tablet (500 mg total) by mouth 3 (three) times daily as needed for muscle spasms.  Dispense: 30 tablet; Refill: 0    No follow-ups on file.   Greig Ring, MD

## 2024-05-14 ENCOUNTER — Telehealth: Payer: Self-pay

## 2024-05-14 NOTE — Telephone Encounter (Signed)
 Copied from CRM [id number]. Topic: Referral - Status >> May 14, 2024  3:21 PM [place] wrote: Reason for CRM: [facility]  Fax: [phone]  Asking for insurance information to be sent for referral

## 2024-05-18 ENCOUNTER — Other Ambulatory Visit: Payer: Self-pay | Admitting: Nurse Practitioner

## 2024-05-18 ENCOUNTER — Telehealth: Payer: Self-pay | Admitting: Nurse Practitioner

## 2024-05-18 DIAGNOSIS — K4090 Unilateral inguinal hernia, without obstruction or gangrene, not specified as recurrent: Secondary | ICD-10-CM

## 2024-05-18 NOTE — Telephone Encounter (Signed)
 Copied from CRM [id number]. Topic: Referral - Question >> May 18, 2024 12:58 PM Viola F wrote: Reason for CRM: Patient called to ask if he can be referred to another Surgeon since [facility] does not accept his insurance. Please call him at [phone] (M) when an update.

## 2024-05-18 NOTE — Telephone Encounter (Signed)
 There is no insurance card scanned into the patients chart.   If no insurance card is available then some sort of insurance verification pulled from the insurance will suffice.   Please look behind me and make sure I am not missing the insurance card scanned anywhere but I do not see it.

## 2024-05-18 NOTE — Telephone Encounter (Signed)
 Referral placed for [facility]

## 2024-05-18 NOTE — Telephone Encounter (Signed)
 Called pt and informed him of new referral placed. No further questions or concerns.

## 2024-05-18 NOTE — Progress Notes (Signed)
 Referral placed in absence of ordering provider

## 2024-05-22 NOTE — Telephone Encounter (Signed)
 No insurance cards have been scanned in. Pt has [facility] for insurance, member ID is [id number], not sure if that'll help or not with their request for ins info.

## 2024-05-24 ENCOUNTER — Ambulatory Visit: Payer: Self-pay | Admitting: General Surgery

## 2024-06-07 ENCOUNTER — Ambulatory Visit: Payer: Self-pay | Admitting: General Surgery

## 2024-06-07 ENCOUNTER — Telehealth: Payer: Self-pay

## 2024-06-07 ENCOUNTER — Ambulatory Visit (INDEPENDENT_AMBULATORY_CARE_PROVIDER_SITE_OTHER): Payer: Self-pay | Admitting: General Surgery

## 2024-06-07 ENCOUNTER — Encounter: Payer: Self-pay | Admitting: General Surgery

## 2024-06-07 ENCOUNTER — Telehealth: Payer: Self-pay | Admitting: General Surgery

## 2024-06-07 ENCOUNTER — Telehealth: Payer: Self-pay | Admitting: *Deleted

## 2024-06-07 VITALS — BP 114/80 | HR 163 | Temp 98.3°F | Ht 69.0 in | Wt 198.8 lb

## 2024-06-07 DIAGNOSIS — K4090 Unilateral inguinal hernia, without obstruction or gangrene, not specified as recurrent: Secondary | ICD-10-CM | POA: Diagnosis not present

## 2024-06-07 NOTE — Telephone Encounter (Signed)
 Faxed cardiac clearance to Dr. Ozell Fell at [phone].

## 2024-06-07 NOTE — Telephone Encounter (Signed)
 Patient has been scheduled for IN OFFICE visit

## 2024-06-07 NOTE — Telephone Encounter (Signed)
 Patient has been advised of Pre-Admission date/time, and Surgery date at [facility].  Surgery Date: 06/29/24 Preadmission Testing Date: Preadmissions to call patient for appointment.   Patient informed of the scheduling process and surgery information given at time of office visit.  Patient has been made aware to call [phone], between 1-3:00pm the day before surgery, to find out what time to arrive for surgery.

## 2024-06-07 NOTE — Progress Notes (Signed)
 Patient ID: [name], male   DOB: 1966-07-31, 58 y.o.   MRN: [id number] CC: Left Inguinal Hernia History of Present Illness [name] is a 58 y.o. male with past medical history significant for coronary artery disease who is presents in consultation for a left inguinal hernia.  The patient notes that over the last month and a half he has noticed pain in his left groin associated with the bulge.  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  He reports that when he lays down the bulge is able to reduce spontaneously on its own.  He denies any symptoms of obstipation and has not had any overlying skin changes to the area.  Of note, the patient had an open right inguinal hernia repair done in 2018 and had a laparoscopic appendectomy done in the 2012.  He is a Education administrator and also smokes about a pack a day.  He drinks 2 tall boy beers per day as well..  Past Medical History Past Medical History:  Diagnosis Date   CAD (coronary artery disease) 03/17/2012   S/P NSTEMI in 5/13 >> Tx with PCI balloon angioplasty diagonal and DES x 1 mid LAD; normal LV function // ETT low risk in 3/19  // Echo 2/19: EF 55-60, Gr 2 DD   HLD (hyperlipidemia)    Low back pain    Obesity    Tobacco abuse    a. 1 1/2 ppd x 20 yrs (02/2012)       Past Surgical History:  Procedure Laterality Date   APPENDECTOMY     CORONARY STENT PLACEMENT  02/2012   balloon PCI to DX; DES to mid LAD   HERNIA REPAIR Right 2016   INTRAVASCULAR ULTRASOUND  03/17/2012   Procedure: INTRAVASCULAR ULTRASOUND;  Surgeon: Lonni JONETTA Cash, MD;  Location[facility] CATH LAB;  Service: Cardiovascular;;   LEFT HEART CATHETERIZATION WITH CORONARY ANGIOGRAM N/A 03/17/2012   Procedure: LEFT HEART CATHETERIZATION WITH CORONARY ANGIOGRAM;  Surgeon: Lonni JONETTA Cash, MD;  Location[facility] CATH LAB;  Service: Cardiovascular;  Laterality: N/A;    Allergies   Allergen Reactions   Amoxicillin     Rash     Current Outpatient Medications  Medication Sig Dispense Refill   aspirin  81 MG tablet Take 81 mg by mouth daily.     atorvastatin  (LIPITOR ) 40 MG tablet Take 1 tablet (40 mg total) by mouth daily. 90 tablet 2   fluticasone  (FLONASE ) 50 MCG/ACT nasal spray Place 2 sprays into both nostrils daily. 16 g 0   methocarbamol  (ROBAXIN ) 500 MG tablet Take 1 tablet (500 mg total) by mouth 3 (three) times daily as needed for muscle spasms. 30 tablet 0   nitroGLYCERIN  (NITROSTAT ) 0.4 MG SL tablet DISSOLVE ONE TABLET UNDER THE TONGUE EVERY 5 MINUTES AS NEEDED FOR CHEST PAIN.  DO NOT EXCEED A TOTAL OF 3 DOSES IN 15 MINUTES 25 tablet 2   sildenafil  (VIAGRA ) 50 MG tablet Take 0.5-1 tablets (25-50 mg total) by mouth daily as needed for erectile dysfunction. 10 tablet 0   No current facility-administered medications for this visit.    Family History Family History  Problem Relation Age of Onset   Heart attack Father        3 mi's - first in 3's.  died @ 45.   Diabetes Father    Hypertension Mother        alive  Diabetes Mother    COPD Mother    Other Unknown        2 brothers alive & well   Healthy Brother    Other Brother        car accident   Cirrhosis Brother        Social History Social History   Tobacco Use   Smoking status: Every Day    Current packs/day: 1.00    Average packs/day: 1 pack/day for 33.0 years (33.0 ttl pk-yrs)    Types: Cigarettes   Smokeless tobacco: Never  Vaping Use   Vaping status: Never Used  Substance Use Topics   Alcohol use: Yes    Alcohol/week: 8.0 standard drinks of alcohol    Types: 8 Cans of beer per week    Comment: 6 pack per week   Drug use: No        ROS Full ROS of systems performed and is otherwise negative there than what is stated in the HPI  Physical Exam Blood pressure 114/80, pulse (!) 163, temperature 98.3 F (36.8 C), temperature source Oral, height 5' 9 (1.753 m), weight 198  lb 12.8 oz (90.2 kg), SpO2 96%.  Alert and oriented x 3, nervous, initially tachycardic but when I checked his pulse it was normal cardiac, normal work of breathing room air, abdomen is soft, nontender nondistended.  He has well-healed laparoscopic port sites.  He has a right groin incision that is well-healed as well.  There is no evidence of recurrence on the right side.  On the left side when standing there is an obvious bulge that increases with Valsalva.  When he lays down I am able to easily reduce this.  Data Reviewed I reviewed his chart and he had a open right inguinal repair in 2018.  He also had a laparoscopic appendectomy in 2012  I have personally reviewed the patient's imaging and medical records.    Assessment    58 year old male with coronary artery disease status post stent on aspirin  who presents with left inguinal hernia.  I discussed the risk, alternatives and benefits of robotic assisted left inguinal hernia repair with mesh.  We discussed the risk including risk of infection, bleeding, damage to vas deferens, testicular ischemia and recurrence.  I also discussed with him that I do not feel a recurrence on the right side but if there is a big recurrence that I would repair that as well.  He will need to be off aspirin  for 7 days.  Will have him undergo cardiac restratification given his history of coronary artery disease.  A total of 45 minutes was spent reviewing the patient's chart, performing an interval history and physical and discussing treatment options with the patient.    [name] 06/07/2024, 2:09 PM

## 2024-06-07 NOTE — Patient Instructions (Signed)
 You have chose to have your hernia repaired. This will be done by Dr. Cornel Diesel at [facility].  If you are on any injectable weight loss medication, you will need to stop taking your GLP-1 injectable (weight loss) medications 8 days before your surgery to avoid any complications with anesthesia.   Please see your (blue) Pre-care information that you have been given today. Our surgery scheduler will call you to verify surgery date and to go over information.   You will need to arrange to be out of work for approximately 1-2 weeks and then you may return with a lifting restriction for 4 more weeks. If you have FMLA or Disability paperwork that needs to be filled out, please have your company fax your paperwork to [phone] or you may drop this by either office. This paperwork will be filled out within 3 days after your surgery has been completed.  You may have a bruise in your groin and also swelling and brusing in your testicle area. You may use ice 4-5 times daily for 15-20 minutes each time. Make sure that you place a barrier between you and the ice pack. To decrease the swelling, you may roll up a bath towel and place it vertically in between your thighs with your testicles resting on the towel. You will want to keep this area elevated as much as possible for several days following surgery.    Inguinal Hernia, Adult Muscles help keep everything in the body in its proper place. But if a weak spot in the muscles develops, something can poke through. That is called a hernia. When this happens in the lower part of the belly (abdomen), it is called an inguinal hernia. (It takes its name from a part of the body in this region called the inguinal canal.) A weak spot in the wall of muscles lets some fat or part of the small intestine bulge through. An inguinal hernia can develop at any age. Men get them more often than women. CAUSES  In adults, an inguinal hernia develops over time. It can be  triggered by: Suddenly straining the muscles of the lower abdomen. Lifting heavy objects. Straining to have a bowel movement. Difficult bowel movements (constipation) can lead to this. Constant coughing. This may be caused by smoking or lung disease. Being overweight. Being pregnant. Working at a job that requires long periods of standing or heavy lifting. Having had an inguinal hernia before. One type can be an emergency situation. It is called a strangulated inguinal hernia. It develops if part of the small intestine slips through the weak spot and cannot get back into the abdomen. The blood supply can be cut off. If that happens, part of the intestine may die. This situation requires emergency surgery. SYMPTOMS  Often, a small inguinal hernia has no symptoms. It is found when a healthcare provider does a physical exam. Larger hernias usually have symptoms.  In adults, symptoms may include: A lump in the groin. This is easier to see when the person is standing. It might disappear when lying down. In men, a lump in the scrotum. Pain or burning in the groin. This occurs especially when lifting, straining or coughing. A dull ache or feeling of pressure in the groin. Signs of a strangulated hernia can include: A bulge in the groin that becomes very painful and tender to the touch. A bulge that turns red or purple. Fever, nausea and vomiting. Inability to have a bowel movement or to pass gas.  DIAGNOSIS  To decide if you have an inguinal hernia, a healthcare provider will probably do a physical examination. This will include asking questions about any symptoms you have noticed. The healthcare provider might feel the groin area and ask you to cough. If an inguinal hernia is felt, the healthcare provider may try to slide it back into the abdomen. Usually no other tests are needed. TREATMENT  Treatments can vary. The size of the hernia makes a difference. Options include: Watchful waiting. This  is often suggested if the hernia is small and you have had no symptoms. No medical procedure will be done unless symptoms develop. You will need to watch closely for symptoms. If any occur, contact your healthcare provider right away. Surgery. This is used if the hernia is larger or you have symptoms. Open surgery. This is usually an outpatient procedure (you will not stay overnight in a hospital). An cut (incision) is made through the skin in the groin. The hernia is put back inside the abdomen. The weak area in the muscles is then repaired by herniorrhaphy or hernioplasty. Herniorrhaphy: in this type of surgery, the weak muscles are sewn back together. Hernioplasty: a patch or mesh is used to close the weak area in the abdominal wall. Laparoscopy. In this procedure, a surgeon makes small incisions. A thin tube with a tiny video camera (called a laparoscope) is put into the abdomen. The surgeon repairs the hernia with mesh by looking with the video camera and using two long instruments. HOME CARE INSTRUCTIONS  After surgery to repair an inguinal hernia: You will need to take pain medicine prescribed by your healthcare provider. Follow all directions carefully. You will need to take care of the wound from the incision. Your activity will be restricted for awhile. This will probably include no heavy lifting for several weeks. You also should not do anything too active for a few weeks. When you can return to work will depend on the type of job that you have. During "watchful waiting" periods, you should: Maintain a healthy weight. Eat a diet high in fiber (fruits, vegetables and whole grains). Drink plenty of fluids to avoid constipation. This means drinking enough water and other liquids to keep your urine clear or pale yellow. Do not lift heavy objects. Do not stand for long periods of time. Quit smoking. This should keep you from developing a frequent cough. SEEK MEDICAL CARE IF:  A bulge  develops in your groin area. You feel pain, a burning sensation or pressure in the groin. This might be worse if you are lifting or straining. You develop a fever of more than 100.5 F (38.1 C). SEEK IMMEDIATE MEDICAL CARE IF:  Pain in the groin increases suddenly. A bulge in the groin gets bigger suddenly and does not go down. For men, there is sudden pain in the scrotum. Or, the size of the scrotum increases. A bulge in the groin area becomes red or purple and is painful to touch. You have nausea or vomiting that does not go away. You feel your heart beating much faster than normal. You cannot have a bowel movement or pass gas. You develop a fever of more than 102.0 F (38.9 C).   This information is not intended to replace advice given to you by your health care provider. Make sure you discuss any questions you have with your health care provider.   Document Released: 02/27/2009 Document Revised: 01/03/2012 Document Reviewed: 04/14/2015 Elsevier Interactive Patient Education [employer]

## 2024-06-07 NOTE — Telephone Encounter (Signed)
   Name: [name]  DOB: 12-03-1965  MRN: [id number]  Primary Cardiologist: Ozell Fell, MD  Chart reviewed as part of pre-operative protocol coverage. Because of [name]'s past medical history and time since last visit, he will require a follow-up in-office visit in order to better assess preoperative cardiovascular risk.  Patient has not been seen since 12/2022.  Pre-op covering staff: - Please schedule appointment and call patient to inform them. If patient already had an upcoming appointment within acceptable timeframe, please add pre-op clearance to the appointment notes so provider is aware. - Please contact requesting surgeon's office via preferred method (i.e, phone, fax) to inform them of need for appointment prior to surgery.  ONEIDA Damien JAYSON Daneen, NP  06/07/2024, 4:08 PM

## 2024-06-07 NOTE — Telephone Encounter (Signed)
   Pre-operative Risk Assessment    Patient Name: [name]  DOB: 12-May-1966 MRN: [id number]   Date of last office visit: 01/11/23 DR. WONDA Date of next office visit: NONE   Request for Surgical Clearance    Procedure:  ROBOTIC LEFT INGUINAL HERNIA REPAIR  Date of Surgery:  Clearance 06/29/24                                Surgeon:  DR. JAYSON ENDOW Surgeon's Group or Practice Name:  [facility] Phone number:  [phone] Fax number:  [phone]   Type of Clearance Requested:   - Medical  - Pharmacy:  Hold Aspirin      Type of Anesthesia:  General    Additional requests/questions:    [name]   06/07/2024, 3:02 PM

## 2024-06-16 ENCOUNTER — Encounter: Payer: Self-pay | Admitting: Urgent Care

## 2024-06-21 ENCOUNTER — Telehealth: Payer: Self-pay

## 2024-06-21 ENCOUNTER — Ambulatory Visit: Attending: Cardiology | Admitting: Emergency Medicine

## 2024-06-21 ENCOUNTER — Encounter: Payer: Self-pay | Admitting: Emergency Medicine

## 2024-06-21 VITALS — BP 128/72 | HR 62 | Ht 70.5 in | Wt 202.3 lb

## 2024-06-21 DIAGNOSIS — Z01810 Encounter for preprocedural cardiovascular examination: Secondary | ICD-10-CM

## 2024-06-21 DIAGNOSIS — Z720 Tobacco use: Secondary | ICD-10-CM | POA: Diagnosis not present

## 2024-06-21 DIAGNOSIS — E785 Hyperlipidemia, unspecified: Secondary | ICD-10-CM | POA: Diagnosis not present

## 2024-06-21 DIAGNOSIS — I2510 Atherosclerotic heart disease of native coronary artery without angina pectoris: Secondary | ICD-10-CM | POA: Diagnosis not present

## 2024-06-21 NOTE — Patient Instructions (Signed)
 Medication Instructions:  NO CHANGES  Lab Work: FASTING LIPID PANEL AND CMET TO BE DONE TODAY.  Testing/Procedures: NONE  Follow-Up: At [facility], you and your health needs are our priority.  As part of our continuing mission to provide you with exceptional heart care, our providers are all part of one team.  This team includes your primary Cardiologist (physician) and Advanced Practice Providers or APPs (Physician Assistants and Nurse Practitioners) who all work together to provide you with the care you need, when you need it.  Your next appointment:   1 YEAR  Provider:   Ozell Fell, MD OR Lum Louis, NP   We recommend signing up for the patient portal called MyChart.  Sign up information is provided on this After Visit Summary.  MyChart is used to connect with patients for Virtual Visits (Telemedicine).  Patients are able to view lab/test results, encounter notes, upcoming appointments, etc.  Non-urgent messages can be sent to your provider as well.   To learn more about what you can do with MyChart, go to [email or web address].

## 2024-06-21 NOTE — Telephone Encounter (Signed)
 Received cardiac clearance from [facility] NP. Based on ACC/AHA guidelines, pt would be at acceptable risk for the planned procedure without further cardiovascular testing.  He may hold Aspirin  for 5-7 days prior to procedure and resume as soon as possible.  Notes are in Epic.

## 2024-06-21 NOTE — Progress Notes (Signed)
 Cardiology Office Note:    Date:  06/21/2024  ID:  [name], DOB 12-28-1965, MRN [id number] PCP: Wendee Lynwood HERO, NP  [facility] Providers Cardiologist:  Ozell Fell, MD Cardiology APP:  Rana Lum CROME, NP       Patient Profile:       Chief Complaint: Preoperative clearance in 1 year follow-up History of Present Illness:  [name] is a 58 y.o. male with visit-pertinent history of coronary artery disease, hyperlipidemia, tobacco abuse  [name] has a hx of CAD s/p NSTEMI in 2013 with PCI/ DES to LAD and POBA to D2 with preserved LVF, HLD, and tobacco use. He initially presented in 2013 with a NSTEMI found to have severe stenosis of the LAD/diagonal bifurcation and was treated with a drug-eluting stent in his LAD and angioplasty of the diagonal. There was minor nonobstructive disease in the left circumflex and right coronary artery and the patient's left ventricular function was normal with an ejection fraction of 55%. Last ETT from 12/2017 was considered low risk.  Echocardiogram 11/2017 with LVEF 55-60%, no RWMA, mild LVH, grade 2 DD, no valvular abnormalities.  He was last seen in office on 01/11/2023 by Dr. Fell.  Patient was doing well at the time without acute complaints.  His LDL is under excellent control.  Patient was working on quitting smoking cold [place].   Discussed the use of AI scribe software for clinical note transcription with the patient, who gave verbal consent to proceed.  History of Present Illness [name] is a 58 year old male with coronary artery disease who presents for cardiac clearance for a hernia procedure.  Today patient is doing well overall.  He is without any acute cardiovascular concerns or complaints today.  He currently experiences no chest pain, shortness of breath, orthopnea, PND, syncope, presyncope, or palpitations. There is no leg swelling or weight gain, and he has lost some weight.  He works as a Education administrator,  engaging in significant physical activity without needing to stop due to cardiac symptoms. He smokes one to one and a half packs of cigarettes daily but intends to quit.  His medications include daily aspirin  and atorvastatin , as well as prescribed Viagra  and nitroglycerin .  Review of systems:  Please see the history of present illness. All other systems are reviewed and otherwise negative.      Studies Reviewed:    EKG Interpretation Date/Time:  Thursday June 21 2024 08:28:03 EDT Ventricular Rate:  61 PR Interval:  170 QRS Duration:  152 QT Interval:  448 QTC Calculation: 450 R Axis:   82  Text Interpretation: Normal sinus rhythm Right bundle branch block When compared with ECG of 15-Dec-2017 11:32, No significant change was found Confirmed by Rana Lum [phone]) on 06/21/2024 8:34:22 AM    Echocardiogram 12/15/2017 - Left ventricle: The cavity size was mildly dilated. Wall    thickness was increased in a pattern of mild LVH. Systolic    function was normal. The estimated ejection fraction was in the    range of 55% to 60%. Wall motion was normal; there were no    regional wall motion abnormalities. Features are consistent with    a pseudonormal left ventricular filling pattern, with concomitant    abnormal relaxation and increased filling pressure (grade 2    diastolic dysfunction).  - Mitral valve: Calcified annulus.   Exercise tolerance test 12/23/2017 Blood pressure demonstrated a normal response to exercise. There was no ST segment deviation noted  during stress.   ETT with good exercise tolerance (9:16); no chest pain; normal BP response; no ST changes; negative adequate ETT; duke treadmill score 9.  Risk Assessment/Calculations:              Physical Exam:   VS:  BP 128/72   Pulse 62   Ht 5' 10.5 (1.791 m)   Wt 202 lb 4.8 oz (91.8 kg)   SpO2 97%   BMI 28.62 kg/m    Wt Readings from Last 3 Encounters:  06/21/24 202 lb 4.8 oz (91.8 kg)  06/07/24 198 lb  12.8 oz (90.2 kg)  05/10/24 204 lb 8 oz (92.8 kg)    GEN: Well nourished, well developed in no acute distress NECK: No JVD; No carotid bruits CARDIAC: RRR, no murmurs, rubs, gallops RESPIRATORY:  Clear to auscultation without rales, wheezing or rhonchi  ABDOMEN: Soft, non-tender, non-distended EXTREMITIES:  No edema; No acute deformity      Assessment and Plan:  Coronary artery disease S/p NSTEMI 2013 with PCI/DES to LAD and POBA to D2 Echocardiogram 11/2017 with LVEF 55 to 60%, no RWMA ETT 12/2017 was low risk EKG today without acute ischemic changes and chronic RBBB - Today patient is without anginal symptoms.  He is a Education administrator and engages in significant physical activity daily without any exertional symptoms.  There is no indication for further ischemic evaluation at this time - Tobacco cessation strongly encouraged - Continue aspirin  81 mg daily, atorvastatin  40 mg daily, and nitroglycerin  as needed  Hyperlipidemia, LDL goal <70 Direct LDL 53 on 07/2023 and well-controlled Triglycerides 487 on 07/2023.  Likely elevated at that time as he was not fasting - Plan for repeat fasting lipid panel and CMET today - Continue atorvastatin  40 mg daily  Tobacco abuse Currently smoking 1 to 1-1/2 packs daily - Complete tobacco cessation encouraged  Preoperative cardiovascular clearance Pending robotic left inguinal hernia repair on 06/29/2024 with [facility]  According to the Revised Cardiac Risk Index (RCRI), his Perioperative Risk of Major Cardiac Event is (%): 0.9. His Functional Capacity in METs is: 6.61 according to the Duke Activity Status Index (DASI). Therefore, based on ACC/AHA guidelines, patient would be at acceptable risk for the planned procedure without further cardiovascular testing. I will route this recommendation to the requesting party via Epic fax function.  He may hold aspirin  for 5-7 days prior to procedure. Please resume aspirin  as soon as possible postprocedure, at the  discretion of the surgeon.         Dispo:  Return in about 1 year (around 06/21/2025).  Signed, Lum LITTIE Louis, NP

## 2024-06-22 ENCOUNTER — Encounter
Admission: RE | Admit: 2024-06-22 | Discharge: 2024-06-22 | Disposition: A | Discharge status: Home / Self Care | Source: Ambulatory Visit | Attending: General Surgery | Admitting: General Surgery

## 2024-06-22 ENCOUNTER — Other Ambulatory Visit: Payer: Self-pay

## 2024-06-22 DIAGNOSIS — Z01818 Encounter for other preprocedural examination: Secondary | ICD-10-CM

## 2024-06-22 DIAGNOSIS — I2510 Atherosclerotic heart disease of native coronary artery without angina pectoris: Secondary | ICD-10-CM

## 2024-06-22 HISTORY — DX: Unilateral inguinal hernia, without obstruction or gangrene, not specified as recurrent: K40.90

## 2024-06-22 LAB — COMPREHENSIVE METABOLIC PANEL WITH GFR
ALT: 24 IU/L (ref 0–44)
AST: 21 IU/L (ref 0–40)
Albumin: 4.2 g/dL (ref 3.8–4.9)
Alkaline Phosphatase: 70 IU/L (ref 44–121)
BUN/Creatinine Ratio: 13 (ref 9–20)
BUN: 12 mg/dL (ref 6–24)
Bilirubin Total: 0.5 mg/dL (ref 0.0–1.2)
CO2: 23 mmol/L (ref 20–29)
Calcium: 9.5 mg/dL (ref 8.7–10.2)
Chloride: 103 mmol/L (ref 96–106)
Creatinine, Ser: 0.93 mg/dL (ref 0.76–1.27)
Globulin, Total: 1.8 g/dL (ref 1.5–4.5)
Glucose: 92 mg/dL (ref 70–99)
Potassium: 4.9 mmol/L (ref 3.5–5.2)
Sodium: 141 mmol/L (ref 134–144)
Total Protein: 6 g/dL (ref 6.0–8.5)
eGFR: 96 mL/min/1.73 (ref >59)

## 2024-06-22 LAB — LIPID PANEL
Chol/HDL Ratio: 2.4 ratio (ref 0.0–5.0)
Cholesterol, Total: 111 mg/dL (ref 100–199)
HDL: 46 mg/dL (ref >39)
LDL Chol Calc (NIH): 42 mg/dL (ref 0–99)
Triglycerides: 130 mg/dL (ref 0–149)
VLDL Cholesterol Cal: 23 mg/dL (ref 5–40)

## 2024-06-22 NOTE — Patient Instructions (Addendum)
 Your procedure is scheduled on: FRIDAY 06/29/24 Report to the Registration Desk on the 1st floor of the [facility].  To find out your arrival time, please call [phone] between 1PM - 3PM on: THURSDAY 06/28/24 If your arrival time is 6:00 am, do not arrive before that time as the [facility] entrance doors do not open until 6:00 am.  REMEMBER: Instructions that are not followed completely may result in serious medical risk, up to and including death; or upon the discretion of your surgeon and anesthesiologist your surgery may need to be rescheduled.  Do not eat food after midnight the night before surgery.  No gum chewing or hard candies.  You may however, drink CLEAR liquids up to 2 hours before you are scheduled to arrive for your surgery. Do not drink anything within 2 hours of your scheduled arrival time.  Clear liquids include: - water  - apple juice without pulp - gatorade (not RED colors) - black coffee or tea (Do NOT add milk or creamers to the coffee or tea) Do NOT drink anything that is not on this list.  One week prior to surgery: Stop Anti-inflammatories (NSAIDS) such as Advil, Aleve, Ibuprofen, Motrin, Naproxen, Naprosyn and Aspirin  based products such as Excedrin, Goody's Powder, BC Powder.  Stop ANY OVER THE COUNTER supplements until after surgery.  You may however, continue to take Tylenol  if needed for pain up until the day of surgery.  Stop aspirin  for 7 days prior to surgery.  Continue taking all of your other prescription medications up until the day of surgery.  ON THE DAY OF SURGERY ONLY TAKE THESE MEDICATIONS WITH SIPS OF WATER:  atorvastatin  (LIPITOR )   Use inhalers on the day of surgery and bring to the hospital.  No Alcohol for 24 hours before or after surgery.  No Smoking including e-cigarettes for 24 hours before surgery.  No chewable tobacco products for at least 6 hours before surgery.  No nicotine  patches on the day of surgery.  Do not use  any recreational drugs for at least a week (preferably 2 weeks) before your surgery.  Please be advised that the combination of cocaine and anesthesia may have negative outcomes, up to and including death. If you test positive for cocaine, your surgery will be cancelled.  On the morning of surgery brush your teeth with toothpaste and water, you may rinse your mouth with mouthwash if you wish. Do not swallow any toothpaste or mouthwash.  Use CHG Soap or wipes as directed on instruction sheet.  Do not wear jewelry, make-up, hairpins, clips or nail polish.  For welded (permanent) jewelry: bracelets, anklets, waist bands, etc.  Please have this removed prior to surgery.  If it is not removed, there is a chance that hospital personnel will need to cut it off on the day of surgery.  Do not wear lotions, powders, or perfumes.   Do not shave body hair from the neck down 48 hours before surgery.   Contact lenses, hearing aids and dentures may not be worn into surgery.   Do not bring valuables to the hospital. [facility] is not responsible for any missing/lost belongings or valuables.   Bring your C-PAP to the hospital in case you may have to spend the night.   Notify your doctor if there is any change in your medical condition (cold, fever, infection).  Wear comfortable clothing (specific to your surgery type) to the hospital.  After surgery, you can help prevent lung complications by  doing breathing exercises.  Take deep breaths and cough every 1-2 hours. Your doctor may order a device called an Incentive Spirometer to help you take deep breaths. When coughing or sneezing, hold a pillow firmly against your incision with both hands. This is called "splinting." Doing this helps protect your incision. It also decreases belly discomfort.  If you are being discharged the day of surgery, you will not be allowed to drive home. You will need a responsible individual to drive you home and stay with  you for 24 hours after surgery.   If you are taking public transportation, you will need to have a responsible individual with you.  Please call the Pre-admissions Testing Dept. at [phone] if you have any questions about these instructions.  Surgery Visitation Policy:  Patients having surgery or a procedure may have two visitors.  Children under the age of 5 must have an adult with them who is not the patient.  Merchandiser, retail to address health-related social needs:  [email or web address]                                                                                                             Preparing for Surgery with CHLORHEXIDINE GLUCONATE (CHG) Soap  Chlorhexidine Gluconate (CHG) Soap  o An antiseptic cleaner that kills germs and bonds with the skin to continue killing germs even after washing  o Used for showering the night before surgery and morning of surgery  Before surgery, you can play an important role by reducing the number of germs on your skin.  CHG (Chlorhexidine gluconate) soap is an antiseptic cleanser which kills germs and bonds with the skin to continue killing germs even after washing.  Please do not use if you have an allergy to CHG or antibacterial soaps. If your skin becomes reddened/irritated stop using the CHG.  1. Shower the NIGHT BEFORE SURGERY and the MORNING OF SURGERY with CHG soap.  2. If you choose to wash your hair, wash your hair first as usual with your normal shampoo.  3. After shampooing, rinse your hair and body thoroughly to remove the shampoo.  4. Use CHG as you would any other liquid soap. You can apply CHG directly to the skin and wash gently with a scrungie or a clean washcloth.  5. Apply the CHG soap to your body only from the neck down. Do not use on open wounds or open sores. Avoid contact with your eyes, ears, mouth, and genitals (private parts). Wash face and genitals (private parts) with your  normal soap.  6. Wash thoroughly, paying special attention to the area where your surgery will be performed.  7. Thoroughly rinse your body with warm water.  8. Do not shower/wash with your normal soap after using and rinsing off the CHG soap.  9. Pat yourself dry with a clean towel.  10. Wear clean pajamas to bed the night before surgery.  12. Place clean sheets on your bed the night of your first shower and do not sleep with pets.  13. Shower again with the CHG soap on the day of surgery prior to arriving at the hospital.  14. Do not apply any deodorants/lotions/powders.  15. Please wear clean clothes to the hospital.

## 2024-06-25 ENCOUNTER — Ambulatory Visit: Payer: Self-pay | Admitting: Emergency Medicine

## 2024-06-28 ENCOUNTER — Telehealth: Payer: Self-pay | Admitting: General Surgery

## 2024-06-28 ENCOUNTER — Encounter: Payer: Self-pay | Admitting: General Surgery

## 2024-06-28 NOTE — Telephone Encounter (Signed)
 Per Luke with the preservice department, patient changed his insurance from [facility] to [employer] the day before his surgery.  [facility]  is out of network with [employer].  After multiple phone calls back and forth between preservice, patient it was determined that cancelling surgery would be best. Patient is called and informed of this. We told him we can send a referral to [facility] that accepts his insurance.  Patient verbalized understanding. Surgery for 06/29/24 is cancelled.

## 2024-06-28 NOTE — Progress Notes (Addendum)
 Perioperative / Anesthesia Services  Pre-Admission Testing Clinical Review / Pre-Operative Anesthesia Consult  Date: 06/28/24  PATIENT DEMOGRAPHICS: Name: Spurgeon Gancarz DOB: 13-Oct-1966 MRN:   [id number]  Note: Available PAT nursing documentation and vital signs have been reviewed. Clinical nursing staff has updated patient's PMH/PSHx, current medication list, and drug allergies/intolerances to ensure complete and comprehensive history available to assist care teams in MDM as it pertains to the aforementioned surgical procedure and anticipated anesthetic course. Extensive review of available clinical information personally performed. [place] [facility] and PSHx updated with any diagnoses/procedures that  may have been inadvertently omitted during his intake with the pre-admission testing department's nursing staff.  PLANNED SURGICAL PROCEDURE(S):   Case: [id number] Date: 06/29/24   Procedure: HERNIORRHAPHY, INGUINAL, ROBOT-ASSISTED, LAPAROSCOPIC (Left: Inguinal) (canceled) - with mesh possible bilateral   Anesthesia type: General   Diagnosis: Non-recurrent unilateral inguinal hernia without obstruction or gangrene [K40.90]   Pre-op diagnosis: left inguinal hernia initial reducible   Location: [facility] ORS FOR ANEST[facility]ESIA [facility]   Surgeons: Marinda Jayson KIDD, MD        CLINICAL DISCUSSION: Rayman Petrosian is a 58 y.o. male who is submitted for pre-surgical anesthesia review and clearance prior to him undergoing the above procedure. Patient is a Current Smoker (33 pack years). Pertinent PMH includes: CAD, NSTEMI, diastolic dysfunction, RBBB, angina, HLD, centrilobular emphysema, ED (on PDE5i), LEFT inguinal hernia, OA, cervical DDD, thoracolumbar DDD, chronic back pain, daily ETOH use, tobacco use/abuse, opioid use disorder (on buprenorphine/naloxone).  Patient is followed by cardiology Corlis, MD). He was last seen in the cardiology clinic on 06/21/2024; notes reviewed. At the time of his  clinic visit, patient doing well overall from a cardiovascular perspective. Patient denied any chest pain, shortness of breath, PND, orthopnea, palpitations, significant peripheral edema, weakness, fatigue, vertiginous symptoms, or presyncope/syncope. Patient with a past medical history significant for cardiovascular diagnoses. Documented physical exam was grossly benign, providing no evidence of acute exacerbation and/or decompensation of the patient's known cardiovascular conditions.  Patient suffered an NSTEMI back in 02/2012.  He underwent diagnostic LEFT heart catheterization on 03/17/2012 revealing 7080% occlusion of the mid LAD and a subtotal occlusion of D1.  PCI was subsequently performed placing a 2.5 x 15 mm Promus Element DES x 1 to the mid LAD.  POBA was performed to the [facility] in D1.  Procedure yielded excellent angiographic result and TIMI-3 flow.  Most recent TTE performed on 12/15/2017 revealed a normal left ventricular systolic function with an EF of 55-60%. There were no regional wall motion abnormalities. Left ventricular diastolic Doppler parameters consistent with pseudonormalization (G2DD). Right ventricular size and function normal with a TAPSE measuring 2.8 cm  (normal range >/= 1.6 cm).  RVSP = 15 mmHg. there was mild mitral annular calcification.  There was trivial tricuspid valve regurgitation.  All transvalvular gradients were noted to be normal providing no evidence of hemodynamically significant valvular stenosis. Aorta normal in size with no evidence of ectasia or aneurysmal dilatation.  Exercise tolerance test performed on 12/23/2017 revealing good exercise tolerance.  Patient was stressed for a total of 9 minutes and 16 seconds, achieving a peak heart rate of 153 bpm, which was 90% of his MPHR of 169 bpm.  Patient did not experience any anginal symptoms during testing.  He demonstrated a normal blood pressure response to exercise.  There were no ST/T wave changes observed.   Patient able to achieve a workload of 10.5 METS.  Duke treadmill score 9.  Blood pressure well controlled  at 128/72 mmHg without the use of pharmacological interventions.  Patient is on atorvastatin  for his HLD diagnosis and ASCVD prevention.  Patient has a supply of short acting nitrates (NTG) to use on an as needed basis for recurrent angina/anginal equivalent symptoms; denied recent use. In the setting of known cardiovascular diagnoses, it is important note that patient is on a PDE5i medication (sildenafil ) for an erectile dysfunction diagnosis.  Patient is not diabetic.  He does not have an OSAH diagnosis.  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 Patient is able to complete all of his  ADL/IADLs without cardiovascular limitation.  Per the DASI, patient is able to achieve at least 4 METS of physical activity without experiencing any significant degree of angina/anginal equivalent symptoms.  No changes were made to his medication regimen.  Patient to follow-up with outpatient cardiology in 1 year or sooner if needed.  Sourish Allender is scheduled for an elective HERNIORRHAPHY, INGUINAL, ROBOT-ASSISTED, LAPAROSCOPIC (Left: Inguinal) on 06/29/2024 with Dr. Jayson MALVA Endow, MD. Given patient's past medical history significant for cardiovascular diagnoses, presurgical cardiac clearance was sought by the PAT team.  Per cardiology, pending robotic left inguinal hernia repair on 06/29/2024 with [facility]. According to the Revised Cardiac Risk Index (RCRI), his Perioperative Risk of Major Cardiac Event is (%): 0.9. His Functional Capacity in METs is: 6.61 according to the Duke Activity Status Index (DASI). Therefore, based on ACC/AHA guidelines, patient would be at ACCEPTABLE risk for the planned procedure without further cardiovascular testing.  In review of the patient's chart, it is noted that he is on daily oral  antithrombotic therapy. He has been instructed on recommendations for holding his daily low-dose ASA for 7 days prior to his procedure with plans to restart as soon as postoperative bleeding risk felt to be minimized by his primary attending surgeon. The patient has been instructed that his last dose should be on 06/21/2024.  Patient denies previous perioperative complications with anesthesia in the past. In review his EMR, it is noted that patient underwent a general anesthetic course at [facility] in 07/2015 without documented complications.   MOST RECENT VITAL SIGNS:    06/21/2024    8:25 AM 06/07/2024    1:33 PM 05/10/2024   11:58 AM  Vitals with BMI  Height 5' 10.5 5' 9 5' 9  Weight 202 lbs 5 oz 198 lbs 13 oz 204 lbs 8 oz  BMI 28.61 29.34 30.19  Systolic 128 114 889  Diastolic 72 80 74  Pulse 62 163 71   PROVIDERS/SPECIALISTS: NOTE: Primary physician provider listed below. Patient may have been seen by APP or partner within same practice.   PROVIDER ROLE / SPECIALTY LAST OV  Endow Jayson MALVA, MD  General Surgery (Surgeon) 06/07/2024  Wendee Lynwood HERO, NP Primary Care Provider 05/10/2024  Wonda Sharper, MD Cardiology 06/21/2024   ALLERGIES: Allergies  Allergen Reactions   Amoxicillin Rash    CURRENT HOME MEDICATIONS: No current facility-administered medications for this encounter.    aspirin  81 MG tablet   atorvastatin  (LIPITOR ) 40 MG tablet   Buprenorphine HCl-Naloxone HCl (SUBOXONE) 8-2 MG FILM   methocarbamol  (ROBAXIN ) 500 MG tablet   nitroGLYCERIN  (NITROSTAT ) 0.4 MG SL tablet   sildenafil  (VIAGRA ) 50 MG tablet   HISTORY: Past Medical History:  Diagnosis Date   Alcohol use (daily)    Anginal pain (HCC)    CAD (coronary artery disease) 03/17/2012  a.) s/p NSTEMI --> LHC/PCI 03/17/2012: 70-80% mLAD (2.5 x 15 mm Promus Element DES), STO D1 (POBA)   Centrilobular emphysema (HCC)    DDD (degenerative disc disease), cervical    DDD (degenerative  disc disease), thoracolumbar    De Quervain's tenosynovitis    Diastolic dysfunction    Erectile dysfunction    a.) on PDE5i (sildenafil )   Genital herpes    HLD (hyperlipidemia)    Left inguinal hernia    Long-term use of aspirin  therapy    Low back pain    NSTEMI (non-ST elevated myocardial infarction) (HCC) 02/2012   a.) LHC/PCI 03/17/2012: 70-80% mLAD (2.5 x 15 mm Promus Element DES), STO D1 (POBA)   Obesity    Opioid use disorder    a.) on buprenorphrine/naloxone   Osteoarthritis    RBBB (right bundle branch block)    Tobacco abuse    Past Surgical History:  Procedure Laterality Date   APPENDECTOMY     CORONARY STENT PLACEMENT  02/2012   balloon PCI to DX; DES to mid LAD   HERNIA REPAIR Right 2016   INTRAVASCULAR ULTRASOUND  03/17/2012   Procedure: INTRAVASCULAR ULTRASOUND;  Surgeon: Lonni JONETTA Cash, MD;  Location: [facility] C[facility] LAB;  Service: Cardiovascular;;   LEFT HEART CATHETERIZATION WITH CORONARY ANGIOGRAM N/A 03/17/2012   Procedure: LEFT HEART CATHETERIZATION WITH CORONARY ANGIOGRAM;  Surgeon: Lonni JONETTA Cash, MD;  Location: [facility] CATH LAB;  Service: Cardiovascular;  Laterality: N/A;   Family History  Problem Relation Age of Onset   Heart attack Father        3 mi's - first in 65's.  died @ 57.   Diabetes Father    Hypertension Mother        alive   Diabetes Mother    COPD Mother    Other Unknown        2 brothers alive & well   Healthy Brother    Other Brother        car accident   Cirrhosis Brother    Social History   Tobacco Use   Smoking status: Every Day    Current packs/day: 1.00    Average packs/day: 1 pack/day for 33.0 years (33.0 ttl pk-yrs)    Types: Cigarettes   Smokeless tobacco: Never  Substance Use Topics   Alcohol use: Yes    Alcohol/week: 8.0 standard drinks of alcohol    Types: 8 Cans of beer per week    Comment: 6 pack per week   LABS:  Office Visit on 06/21/2024  Component Date Value Ref Range Status   Glucose 06/21/2024  92  70 - 99 mg/dL Final   BUN 91/71/7974 12  6 - 24 mg/dL Final   Creatinine, Ser 06/21/2024 0.93  0.76 - 1.27 mg/dL Final   eGFR 91/71/7974 96  >59 mL/min/1.73 Final   BUN/Creatinine Ratio 06/21/2024 13  9 - 20 Final   Sodium 06/21/2024 141  134 - 144 mmol/L Final   Potassium 06/21/2024 4.9  3.5 - 5.2 mmol/L Final   Chloride 06/21/2024 103  96 - 106 mmol/L Final   CO2 06/21/2024 23  20 - 29 mmol/L Final   Calcium  06/21/2024 9.5  8.7 - 10.2 mg/dL Final   Total Protein 91/71/7974 6.0  6.0 - 8.5 g/dL Final   Albumin 91/71/7974 4.2  3.8 - 4.9 g/dL Final   Globulin, Total 06/21/2024 1.8  1.5 - 4.5 g/dL Final   Bilirubin Total 06/21/2024 0.5  0.0 - 1.2 mg/dL Final  Alkaline Phosphatase 06/21/2024 70  44 - 121 IU/L Final   AST 06/21/2024 21  0 - 40 IU/L Final   ALT 06/21/2024 24  0 - 44 IU/L Final   Cholesterol, Total 06/21/2024 111  100 - 199 mg/dL Final   Triglycerides 91/71/7974 130  0 - 149 mg/dL Final   HDL 91/71/7974 46  >39 mg/dL Final   VLDL Cholesterol Cal 06/21/2024 23  5 - 40 mg/dL Final   LDL Chol Calc (NIH) 06/21/2024 42  0 - 99 mg/dL Final   Chol/HDL Ratio 06/21/2024 2.4  0.0 - 5.0 ratio Final   Comment:                                   T. Chol/HDL Ratio                                             Men  Women                               1/2 Avg.Risk  3.4    3.3                                   Avg.Risk  5.0    4.4                                2X Avg.Risk  9.6    7.1                                3X Avg.Risk 23.4   11.0     ECG: Date: 06/21/2024 Time ECG obtained: 0828 AM Rate: 61 bpm Rhythm: Normal sinus rhythm; RBBB Axis (leads I and aVF): normal Intervals: PR 170 ms. QRS 152 ms. QTc 450 ms. ST segment and T wave changes: No evidence of acute T wave abnormalities or significant ST segment elevation or depression.  Evidence of a possible, age undetermined, prior infarct:  No Comparison: Similar to previous tracing obtained on 12/15/2017   IMAGING /  PROCEDURES: DG CHEST 2 VIEW performed on 12/12/2023 Trachea is midline.  Heart size normal.  Subtle interstitial prominence in the right perihilar region and possibly lower lobes suggesting an infectious bronchiolitis.. No dense airspace consolidation or pleural fluid.  DG LUMBAR SPINE COMPLETE performed on 10/08/2023 Mild L1-2 through L3-4 degenerative disc and endplate changes, new from prior remote 10/24/2012 radiographs. 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  DG THORACIC SPINE 2 VIEW performed on 10/08/2023 Mild levocurvature centered at T3.  Multilevel mild-to-moderate degenerative disc and endplate changes.  CT CERVICAL SPINE WO CONTRAST performed on 10/08/2023 No acute traumatic injury identified in the cervical spine. Generally mild for age cervical spine degeneration but disc degeneration at C3-C4 might cause mild spinal stenosis. Emphysema   EXERCISE TOLERANCE TEST performed on 12/23/2017 Blood pressure demonstrated a normal response to exercise. There was no ST segment deviation noted during stress. ETT with good exercise tolerance (9:16); no chest pain; normal BP response; no ST changes; negative adequate ETT; duke treadmill score 9.  TRANSTHORACIC ECHOCARDIOGRAM performed on 12/15/2017 Normal left ventricular systolic function with an EF of 55-60% Mild left ventricular cavity size dilatation. Mild LVH No regional wall motion abnormalities Left ventricular diastolic Doppler parameters consistent with pseudonormalization (G2DD). Normal right ventricular size and function Mild mitral annular calcification No significant valvular regurgitation Normal gradients; no valvular stenosis  IMPRESSION AND PLAN: Benny Deutschman has been referred for pre-anesthesia review and clearance prior to him undergoing the planned anesthetic and procedural courses. Available labs, pertinent testing, and imaging results were personally reviewed by me in preparation for  upcoming operative/procedural course. [facility] medical record has been updated following extensive record review and patient interview with PAT staff.   Patient unable to come into PAT office prior to planned surgical procedure.  He will need a preoperative CBC on the day of surgery in order to complete his workup.  Order is in for this lab testing and patient is aware.  This patient has been appropriately cleared by cardiology with an overall ACCEPTABLE risk of patient experiencing significant perioperative cardiovascular complications. Based on clinical review performed today (06/28/24), barring any significant acute changes in the patient's overall condition, it is anticipated that he will be able to proceed with the planned surgical intervention. Any acute changes in clinical condition may necessitate his procedure being postponed and/or cancelled. Patient will meet with anesthesia team (MD and/or CRNA) on the day of his procedure for preoperative evaluation/assessment. Questions regarding anesthetic course will be fielded at that time.   Pre-surgical instructions were reviewed with the patient during his PAT appointment, and questions were fielded to satisfaction by PAT clinical staff. He has been instructed on which medications that he will need to hold prior to surgery, as well as the ones that have been deemed safe/appropriate to take on the day of his procedure. As part of the general education provided by PAT, patient made aware both verbally and in writing, that he would need to abstain from the use of any illegal substances during his perioperative course. He was advised that failure to follow the provided instructions could necessitate case cancellation or result in serious perioperative complications up to and including death. Patient encouraged to contact PAT and/or his surgeon's office to discuss any questions or concerns that may arise prior to surgery; verbalized understanding.   Dorise Pereyra, MSN, APRN, FNP-C, CEN [facility]  Perioperative Services Nurse Practitioner Phone: [phone] Fax: [phone] 06/28/24 4:05 PM  NOTE: This note has been prepared using Dragon dictation software. Despite my best ability to proofread, there is always the potential that unintentional transcriptional errors may still occur from this process.

## 2024-06-29 ENCOUNTER — Encounter: Admission: RE | Payer: Self-pay | Source: Home / Self Care

## 2024-06-29 ENCOUNTER — Other Ambulatory Visit: Payer: Self-pay

## 2024-06-29 ENCOUNTER — Ambulatory Visit: Admission: RE | Admit: 2024-06-29 | Source: Home / Self Care | Admitting: General Surgery

## 2024-06-29 ENCOUNTER — Telehealth: Payer: Self-pay | Admitting: Nurse Practitioner

## 2024-06-29 DIAGNOSIS — Z01818 Encounter for other preprocedural examination: Secondary | ICD-10-CM

## 2024-06-29 DIAGNOSIS — I2510 Atherosclerotic heart disease of native coronary artery without angina pectoris: Secondary | ICD-10-CM

## 2024-06-29 DIAGNOSIS — K4090 Unilateral inguinal hernia, without obstruction or gangrene, not specified as recurrent: Secondary | ICD-10-CM

## 2024-06-29 HISTORY — DX: Angina pectoris, unspecified: I20.9

## 2024-06-29 HISTORY — DX: Radial styloid tenosynovitis (de quervain): M65.4

## 2024-06-29 HISTORY — DX: Other intervertebral disc degeneration, thoracolumbar region: M51.35

## 2024-06-29 HISTORY — DX: Male erectile dysfunction, unspecified: N52.9

## 2024-06-29 HISTORY — DX: Unspecified osteoarthritis, unspecified site: M19.90

## 2024-06-29 HISTORY — DX: Unspecified right bundle-branch block: I45.10

## 2024-06-29 HISTORY — DX: Long term (current) use of aspirin: Z79.82

## 2024-06-29 HISTORY — DX: Centrilobular emphysema: J43.2

## 2024-06-29 HISTORY — DX: Herpesviral infection of urogenital system, unspecified: A60.00

## 2024-06-29 HISTORY — DX: Opioid use, unspecified, uncomplicated: F11.90

## 2024-06-29 HISTORY — DX: Other ill-defined heart diseases: I51.89

## 2024-06-29 HISTORY — DX: Other cervical disc degeneration, unspecified cervical region: M50.30

## 2024-06-29 HISTORY — DX: Alcohol use, unspecified, uncomplicated: F10.90

## 2024-06-29 SURGERY — HERNIORRHAPHY, INGUINAL, ROBOT-ASSISTED, LAPAROSCOPIC
Anesthesia: General | Site: Inguinal | Laterality: Left

## 2024-06-29 NOTE — Telephone Encounter (Signed)
 Referral has been faxed to [facility].

## 2024-06-29 NOTE — Telephone Encounter (Signed)
 Copied from CRM #[id number]. Topic: Appointments - Scheduling Inquiry for Clinic >> Jun 29, 2024  1:37 PM Jasmin G wrote: Reason for CRM: Pt called to request scheduling for a hernia removal surgery, he states that it was supposed to get done today at [facility] at 1:00 p.m but that his Insurance overdue the process, please call him back at [phone] to schedule a new surgery date, he stated that he though clinic recommended some place in [place].

## 2024-07-01 ENCOUNTER — Emergency Department (HOSPITAL_COMMUNITY)
Admission: EM | Admit: 2024-07-01 | Discharge: 2024-07-01 | Disposition: A | Discharge status: Home / Self Care | Attending: Emergency Medicine | Admitting: Emergency Medicine

## 2024-07-01 ENCOUNTER — Encounter (HOSPITAL_COMMUNITY): Payer: Self-pay | Admitting: *Deleted

## 2024-07-01 ENCOUNTER — Other Ambulatory Visit: Payer: Self-pay

## 2024-07-01 DIAGNOSIS — K4090 Unilateral inguinal hernia, without obstruction or gangrene, not specified as recurrent: Secondary | ICD-10-CM | POA: Insufficient documentation

## 2024-07-01 DIAGNOSIS — I2510 Atherosclerotic heart disease of native coronary artery without angina pectoris: Secondary | ICD-10-CM | POA: Diagnosis not present

## 2024-07-01 DIAGNOSIS — Z7982 Long term (current) use of aspirin: Secondary | ICD-10-CM | POA: Insufficient documentation

## 2024-07-01 DIAGNOSIS — R109 Unspecified abdominal pain: Secondary | ICD-10-CM | POA: Diagnosis present

## 2024-07-01 HISTORY — DX: Unilateral inguinal hernia, without obstruction or gangrene, not specified as recurrent: K40.90

## 2024-07-01 LAB — URINALYSIS, ROUTINE W REFLEX MICROSCOPIC
Bacteria, UA: NONE SEEN
Bilirubin Urine: NEGATIVE
Glucose, UA: NEGATIVE mg/dL
Ketones, ur: NEGATIVE mg/dL
Leukocytes,Ua: NEGATIVE
Nitrite: NEGATIVE
Protein, ur: 30 mg/dL — AB
Specific Gravity, Urine: 1.027 (ref 1.005–1.030)
pH: 5 (ref 5.0–8.0)

## 2024-07-01 LAB — COMPREHENSIVE METABOLIC PANEL WITH GFR
ALT: 16 U/L (ref 0–44)
AST: 18 U/L (ref 15–41)
Albumin: 4.1 g/dL (ref 3.5–5.0)
Alkaline Phosphatase: 61 U/L (ref 38–126)
Anion gap: 9 (ref 5–15)
BUN: 15 mg/dL (ref 6–20)
CO2: 26 mmol/L (ref 22–32)
Calcium: 9.3 mg/dL (ref 8.9–10.3)
Chloride: 103 mmol/L (ref 98–111)
Creatinine, Ser: 0.92 mg/dL (ref 0.61–1.24)
GFR, Estimated: >60 mL/min (ref >60)
Glucose, Bld: 98 mg/dL (ref 70–99)
Potassium: 4 mmol/L (ref 3.5–5.1)
Sodium: 138 mmol/L (ref 135–145)
Total Bilirubin: 1 mg/dL (ref 0.0–1.2)
Total Protein: 6.8 g/dL (ref 6.5–8.1)

## 2024-07-01 LAB — CBC
HCT: 49.6 % (ref 39.0–52.0)
Hemoglobin: 16.3 g/dL (ref 13.0–17.0)
MCH: 30.6 pg (ref 26.0–34.0)
MCHC: 32.9 g/dL (ref 30.0–36.0)
MCV: 93.2 fL (ref 80.0–100.0)
Platelets: 271 K/uL (ref 150–400)
RBC: 5.32 MIL/uL (ref 4.22–5.81)
RDW: 12.2 % (ref 11.5–15.5)
WBC: 9.2 K/uL (ref 4.0–10.5)
nRBC: 0 % (ref 0.0–0.2)

## 2024-07-01 LAB — I-STAT CHEM 8, ED
BUN: 18 mg/dL (ref 6–20)
Calcium, Ion: 1.14 mmol/L — ABNORMAL LOW (ref 1.15–1.40)
Chloride: 104 mmol/L (ref 98–111)
Creatinine, Ser: 1 mg/dL (ref 0.61–1.24)
Glucose, Bld: 101 mg/dL — ABNORMAL HIGH (ref 70–99)
HCT: 49 % (ref 39.0–52.0)
Hemoglobin: 16.7 g/dL (ref 13.0–17.0)
Potassium: 4 mmol/L (ref 3.5–5.1)
Sodium: 139 mmol/L (ref 135–145)
TCO2: 26 mmol/L (ref 22–32)

## 2024-07-01 MED ORDER — ACETAMINOPHEN 325 MG PO TABS
ORAL_TABLET | ORAL | Status: AC
Start: 2024-07-01 — End: 2024-07-01
  Filled 2024-07-01: qty 2

## 2024-07-01 MED ORDER — OXYCODONE-ACETAMINOPHEN 5-325 MG PO TABS
1.0000 | ORAL_TABLET | Freq: Once | ORAL | Status: DC
  Filled 2024-07-01: qty 1

## 2024-07-01 MED ORDER — ACETAMINOPHEN 325 MG PO TABS
650.0000 mg | ORAL_TABLET | Freq: Four times a day (QID) | ORAL | Status: AC | PRN
  Administered 2024-07-01: 650 mg via ORAL

## 2024-07-01 NOTE — ED Triage Notes (Signed)
 Pt states he has a L inguinal hernia.  Hx of same that needed surgical intervention.  Pain for some time that is now unbearable.

## 2024-07-01 NOTE — ED Provider Notes (Signed)
 [place] EMERGENCY DEPARTMENT AT [facility] Provider Note   CSN: [id number] Arrival date & time: 07/01/24  1203     Patient presents with: Inguinal Hernia   Aquarius Latouche is a 58 y.o. male past medical history significant for left inguinal hernia, NSTEMI, CAD, and opioid use disorder presents today for increased pain of his left inguinal hernia.  Patient reports increased pain and nausea and vomiting.  Patient denies constipation, fever, chills, erythema, or urinary symptoms.   HPI     Prior to Admission medications   Medication Sig Start Date End Date Taking? Authorizing Provider  aspirin  81 MG tablet Take 81 mg by mouth daily.    [provider]  atorvastatin  (LIPITOR ) 40 MG tablet Take 1 tablet (40 mg total) by mouth daily. 08/25/23   Wendee Lynwood HERO, NP  Buprenorphine HCl-Naloxone HCl (SUBOXONE) 8-2 MG FILM Place 0.25-0.5 Film under the tongue 2 (two) times daily.    [provider]  methocarbamol  (ROBAXIN ) 500 MG tablet Take 1 tablet (500 mg total) by mouth 3 (three) times daily as needed for muscle spasms. 05/10/24   Bedsole, Amy E, MD  nitroGLYCERIN  (NITROSTAT ) 0.4 MG SL tablet DISSOLVE ONE TABLET UNDER THE TONGUE EVERY 5 MINUTES AS NEEDED FOR CHEST PAIN.  DO NOT EXCEED A TOTAL OF 3 DOSES IN 15 MINUTES 10/20/23   Wonda Sharper, MD  sildenafil  (VIAGRA ) 50 MG tablet Take 0.5-1 tablets (25-50 mg total) by mouth daily as needed for erectile dysfunction. 08/25/23   Wendee Lynwood HERO, NP    Allergies: Amoxicillin    Review of Systems  Genitourinary:        Inguinal hernia    Updated Vital Signs BP 100/76 (BP Location: Left Arm)   Pulse 77   Temp 97.8 F (36.6 C)   Resp 18   Ht 5' 10.5 (1.791 m)   Wt 91.8 kg   SpO2 97%   BMI 28.62 kg/m   Physical Exam Vitals and nursing note reviewed. Exam conducted with a chaperone present.  Constitutional:      General: He is not in acute distress.    Appearance: He is well-developed. He is not  ill-appearing.  HENT:     Head: Normocephalic and atraumatic.     Right Ear: External ear normal.     Left Ear: External ear normal.  Eyes:     Conjunctiva/sclera: Conjunctivae normal.  Cardiovascular:     Rate and Rhythm: Normal rate and regular rhythm.     Heart sounds: No murmur heard. Pulmonary:     Effort: Pulmonary effort is normal. No respiratory distress.     Breath sounds: Normal breath sounds.  Abdominal:     Palpations: Abdomen is soft.     Tenderness: There is no abdominal tenderness.  Genitourinary:      Comments: Small hernia to the area noted above with tenderness to palpation.  Hernia is easily reducible when patient placed in Trendelenburg position. Musculoskeletal:        General: No swelling.     Cervical back: Neck supple.  Skin:    General: Skin is warm and dry.     Capillary Refill: Capillary refill takes less than 2 seconds.  Neurological:     Mental Status: He is alert.  Psychiatric:        Mood and Affect: Mood normal.     (all labs ordered are listed, but only abnormal results are displayed) Labs Reviewed  URINALYSIS, ROUTINE W REFLEX MICROSCOPIC - Abnormal;  Notable for the following components:      Result Value   Hgb urine dipstick MODERATE (*)    Protein, ur 30 (*)    All other components within normal limits  I-STAT CHEM 8, ED - Abnormal; Notable for the following components:   Glucose, Bld 101 (*)    Calcium , Ion 1.14 (*)    All other components within normal limits  COMPREHENSIVE METABOLIC PANEL WITH GFR  CBC    EKG: None  Radiology: No results found.   Procedures   Medications Ordered in the ED  oxyCODONE -acetaminophen  (PERCOCET/ROXICET) 5-325 MG per tablet 1 tablet (has no administration in time range)  acetaminophen  (TYLENOL ) tablet 650 mg (650 mg Oral Given 07/01/24 1244)                                    Medical Decision Making Amount and/or Complexity of Data Reviewed Labs: ordered.  Risk OTC drugs.   This  patient presents to the ED for concern of inguinal hernia differential diagnosis includes inguinal hernia, incarcerated hernia, strangulated hernia    Additional history obtained Additional history obtained from Electronic Medical Record External records from outside source obtained and reviewed including general surgery notes   Lab Tests:  I Ordered, and personally interpreted labs.  The pertinent results include: CBC unremarkable, CMP unremarkable, UA with moderate hemoglobin and 30 protein  Medicines ordered and prescription drug management:  I ordered medication including Percocet    I have reviewed the patients home medicines and have made adjustments as needed   Problem List / ED Course:  Considered for admission or further workup however patient's vital signs, physical exam, and labs are reassuring.  Patient has no signs of incarceration or strangulation of his hernia.  Patient advised to follow-up with [facility] surgery.  Patient advised to alternate Tylenol  and Motrin for pain and wear formfitting undergarments to help with compression.  Patient given return precautions.  I feel patient is safe for discharge at this time.     Final diagnoses:  Left inguinal hernia    ED Discharge Orders     None          Francis Ileana LOISE DEVONNA 07/01/24 1410    Doretha Folks, MD 07/04/24 2308

## 2024-07-01 NOTE — Discharge Instructions (Addendum)
 Today you were seen for an inguinal hernia.  Please follow-up with [facility] surgery to schedule your hernia repair.  You may alternate Tylenol  and Motrin as needed for pain.  Please wear formfitting undergarments as decompression can help keep your hernia reduced.   Thank you for letting us  treat you today. After reviewing your labs, I feel you are safe to go home. Please follow up with your PCP in the next several days and provide them with your records from this visit. Return to the Emergency Room if pain becomes severe or symptoms worsen.

## 2024-07-09 ENCOUNTER — Ambulatory Visit: Payer: Self-pay

## 2024-07-09 NOTE — Telephone Encounter (Signed)
See other triage message

## 2024-07-09 NOTE — Telephone Encounter (Signed)
 FYI Only or Action Required?: FYI only for provider.  Patient was last seen in primary care on 05/10/2024 by Avelina Greig BRAVO, MD.  Called Nurse Triage reporting Groin Pain.  Symptoms began a week ago.  Interventions attempted: OTC medications: Tylenol .  Symptoms are: gradually worsening.  Triage Disposition: Go to ED Now (Notify PCP)  Patient/caregiver understands and will follow disposition?: Yes      Copied from CRM #[id number]. Topic: Clinical - Red Word Triage >> Jul 09, 2024 10:27 AM [place] A wrote: [facility] that prompted transfer to Nurse Triage: Patient's wife called Hernia located on left side groin, it is protruding out. Patient is in severe pain Reason for Disposition  Hernia is painful or tender to touch  Answer Assessment - Initial Assessment Questions Call disconnected on transfer from PAS.This RN returned call to patient. He states he has to lie down due to symptoms. L side groin area. Tylenol  for pain. Patient denies chest pain or SOB. Patient states he has  severe abd pain and area tender to touch. Due to symptoms, this RN advised pt to go to ED. Patient states he can get his wife to take him to the ED.   1. ONSET:  When did this first appear?     Over a week ago  2. APPEARANCE: What does it look like?     Patient states the area appears to bulge out  3. SIZE: How big is it? (e.g., inches, cm; or compare to coins, fruit)     Area is bulging out 4. LOCATION: Where exactly is the hernia located?     L side groin area.  5. PAIN: Is there any pain? If Yes, ask: How bad is it?  (Scale 0-10; or none, mild, moderate, severe)     Severe  6. DIAGNOSIS: Have you been seen by a doctor (or NP/PA) for this? Did the doctor diagnose you as having a hernia?     Yes,  7. OTHER SYMPTOMS: Do you have any other symptoms? (e.g., fever, abdomen pain, vomiting)     Nausea, vomiting, abdominal pain  Protocols used: Hernia-A-AH

## 2024-07-09 NOTE — Telephone Encounter (Signed)
 I spoke with pt and advised per CHRISTELLA Crandall NP note. Pt voiced understanding and said he is going home and will talk with his wife and he will go to an ED somewhere. Pt said there is a 2 month wait at [facility] for hernia surgery and he has spoken with other surgeons and they have to have money upfront. P T said area at groin is protruding and very painful right now. Pt said it would be at least 30 days before pt can get any type of insurance including medicaid.pt said he will go to [facility] ED or [facility] ED. Sending note to CHRISTELLA Crandall NP>

## 2024-07-09 NOTE — Telephone Encounter (Signed)
 Patient has been seen by surgery and then again at [facility] [place] due to insurance change. If he is having that much pain he may need to be seen in the ed

## 2024-07-09 NOTE — Telephone Encounter (Signed)
 Agree with evaluation as it sounds like the hernia could be incarcerated

## 2024-07-09 NOTE — Telephone Encounter (Signed)
 FYI Only or Action Required?: Action required by provider: referral request, update on patient condition, and Refused ER.  Patient was last seen in primary care on 05/10/2024 by Avelina Greig BRAVO, MD.  Called Nurse Triage reporting Referral.  Symptoms began several days ago.  Interventions attempted: OTC medications: Tylenol  and Rest, hydration, or home remedies.  Symptoms are: gradually worsening.  Triage Disposition: Go to ED Now (Notify PCP)  Patient/caregiver understands and will follow disposition?: No, wishes to speak with PCP  Copied from CRM #[id number]. Topic: Clinical - Red Word Triage >> Jul 09, 2024 11:53 AM Mesmerise C wrote: [facility] that prompted transfer to Nurse Triage: Patient calling about a referral states he's still in a lot of pain from hernia Reason for Disposition  Hernia is painful or tender to touch  Answer Assessment - Initial Assessment Questions Additional info:  Triaged earlier today and advised on ER. Girlfriend Arland calling back now to check in on surgical referral, states it is urgent and want to speak with surgeon today if possible. This Clinical research associate asked if patient presented to emergency room after triage this morning and Arland states they did go to ER, he was offered pain meds and sent home. Refusing ER at this time and requesting pcp follow up call.    1. ONSET:  When did this first appear?     Several days 2. APPEARANCE: What does it look like?     bulge 3. SIZE: How big is it? (e.g., inches, cm; or compare to coins, fruit)     unchanged 4. LOCATION: Where exactly is the hernia located?     lower 5. PATTERN: Does the swelling come and go, or has it been constant since it started?     constant 6. PAIN: Is there any pain? If Yes, ask: How bad is it?  (Scale 0-10; or none, mild, moderate, severe)     severe 7. DIAGNOSIS: Have you been seen by a doctor (or NP/PA) for this? Did the doctor diagnose you as having a hernia?     07/01/24 at  ER 8. OTHER SYMPTOMS: Do you have any other symptoms? (e.g., fever, abdomen pain, vomiting)     Able to reduce but does not stay  Protocols used: Hernia-A-AH

## 2024-07-10 NOTE — Telephone Encounter (Unsigned)
 Copied from CRM #[id number]. Topic: Referral - Request for Referral >> Jul 09, 2024 10:24 AM [place] A wrote: Did the patient discuss referral with their provider in the last year? Yes (If No - schedule appointment) (If Yes - send message)  Appointment offered? No  Type of order/referral and detailed reason for visit: Hernia   Preference of office, provider, location: [facility] [place]  If referral order, have you been seen by this specialty before? No (If Yes, this issue or another issue? When? Where?  Can we respond through MyChart? Yes >> Jul 09, 2024 11:49 AM Mesmerise C wrote: Patient checking status of referral for hernia states it need to be faxed to [phone]

## 2024-07-11 NOTE — Telephone Encounter (Signed)
 Patient went to [facility] and had hernia repair yesterday 07/10/24.

## 2024-08-08 ENCOUNTER — Other Ambulatory Visit: Payer: Self-pay | Admitting: Nurse Practitioner

## 2024-08-08 DIAGNOSIS — E782 Mixed hyperlipidemia: Secondary | ICD-10-CM

## 2024-08-08 NOTE — Telephone Encounter (Signed)
 Needs CPE scheduled in the month of January 2026. Any slot will do

## 2024-08-21 NOTE — Telephone Encounter (Signed)
 Lvm to schedule physical with provider in month of Jan

## 2024-11-13 ENCOUNTER — Encounter: Payer: Self-pay | Admitting: Nurse Practitioner

## 2024-11-13 NOTE — Progress Notes (Unsigned)
" ° °  Established Patient Office Visit  Subjective   Patient ID: [name], male    DOB: 11/11/1965  Age: 59 y.o. MRN: [id number]  No chief complaint on file.   HPI  CAD: Patient is followed by cardiology currently maintained on aspirin  81 mg daily and atorvastatin  40 mg daily.  He does have nitroglycerin  as needed  ED: Currently maintained on Viagra  25 to 50 mg daily as needed  Back pain?:  Maintained on methocarbamol  500 mg 3 times daily as needed and Suboxone quarter to half film twice daily?  for complete physical and follow up of chronic conditions.  Immunizations: -Tetanus: Completed in 2024 -Influenza:? -Shingles:? -Pneumonia:?  Diet: Fair diet.  Exercise: No regular exercise.  Eye exam: Completes annually  Dental exam: Completes semi-annually    Colonoscopy: Completed in Cologuard was ordered 07/29/2023 but not completed Lung Cancer Screening: Completed in   PSA: Due  Sleep:     {History (Optional):23778}  ROS    Objective:     There were no vitals taken for this visit. {Vitals History (Optional):23777}  Physical Exam   No results found for any visits on 11/13/24.  {Labs (Optional):23779}  The ASCVD Risk score (Arnett DK, et al., 2019) failed to calculate for the following reasons:   Risk score cannot be calculated because patient has a medical history suggesting prior/existing ASCVD   * - Cholesterol units were assumed    Assessment & Plan:   Problem List Items Addressed This Visit   None   No follow-ups on file.    Adina Crandall, NP  "

## 2024-11-30 ENCOUNTER — Other Ambulatory Visit: Payer: Self-pay | Admitting: Nurse Practitioner

## 2024-11-30 DIAGNOSIS — E782 Mixed hyperlipidemia: Secondary | ICD-10-CM
